# Patient Record
Sex: Female | Born: 1990
Health system: Southern US, Community
[De-identification: ages and names within clinical notes are randomized; demographics above are authoritative.]

## PROBLEM LIST (undated history)

## (undated) DIAGNOSIS — F32A Depression, unspecified: Secondary | ICD-10-CM

## (undated) DIAGNOSIS — R519 Headache, unspecified: Secondary | ICD-10-CM

## (undated) DIAGNOSIS — F419 Anxiety disorder, unspecified: Secondary | ICD-10-CM

## (undated) DIAGNOSIS — F329 Major depressive disorder, single episode, unspecified: Secondary | ICD-10-CM

## (undated) DIAGNOSIS — R51 Headache: Secondary | ICD-10-CM

## (undated) DIAGNOSIS — Z87440 Personal history of urinary (tract) infections: Secondary | ICD-10-CM

## (undated) HISTORY — DX: Headache: R51

## (undated) HISTORY — DX: Personal history of urinary (tract) infections: Z87.440

## (undated) HISTORY — DX: Major depressive disorder, single episode, unspecified: F32.9

## (undated) HISTORY — DX: Headache, unspecified: R51.9

## (undated) HISTORY — DX: Anxiety disorder, unspecified: F41.9

## (undated) HISTORY — DX: Depression, unspecified: F32.A

---

## 2014-05-20 LAB — HM PAP SMEAR: HM Pap smear: NEGATIVE

## 2015-04-04 LAB — BASIC METABOLIC PANEL
BUN: 11 mg/dL (ref 4–21)
CREATININE: 0.7 mg/dL (ref 0.5–1.1)
Glucose: 94 mg/dL
Potassium: 4.5 mmol/L (ref 3.4–5.3)
Sodium: 140 mmol/L (ref 137–147)

## 2015-04-04 LAB — HEPATIC FUNCTION PANEL
ALK PHOS: 72 U/L (ref 25–125)
ALT: 9 U/L (ref 7–35)
AST: 16 U/L (ref 13–35)

## 2015-04-04 LAB — CBC AND DIFFERENTIAL
HEMATOCRIT: 40 % (ref 36–46)
Hemoglobin: 13.1 g/dL (ref 12.0–16.0)
PLATELETS: 251 10*3/uL (ref 150–399)
WBC: 4.2 10*3/mL

## 2015-04-04 LAB — LIPID PANEL
CHOLESTEROL: 179 mg/dL (ref 0–200)
HDL: 56 mg/dL (ref 35–70)
LDL CALC: 99 mg/dL
Triglycerides: 119 mg/dL (ref 40–160)

## 2015-04-04 LAB — TSH: TSH: 1.43 u[IU]/mL (ref 0.41–5.90)

## 2016-08-03 ENCOUNTER — Ambulatory Visit (INDEPENDENT_AMBULATORY_CARE_PROVIDER_SITE_OTHER): Payer: No Typology Code available for payment source | Admitting: Family Medicine

## 2016-08-03 ENCOUNTER — Encounter: Payer: Self-pay | Admitting: Family Medicine

## 2016-08-03 DIAGNOSIS — F3341 Major depressive disorder, recurrent, in partial remission: Secondary | ICD-10-CM

## 2016-08-03 DIAGNOSIS — G8929 Other chronic pain: Secondary | ICD-10-CM | POA: Diagnosis not present

## 2016-08-03 DIAGNOSIS — F324 Major depressive disorder, single episode, in partial remission: Secondary | ICD-10-CM | POA: Insufficient documentation

## 2016-08-03 DIAGNOSIS — G44229 Chronic tension-type headache, not intractable: Secondary | ICD-10-CM | POA: Insufficient documentation

## 2016-08-03 DIAGNOSIS — M549 Dorsalgia, unspecified: Secondary | ICD-10-CM

## 2016-08-03 MED ORDER — NAPROXEN 500 MG PO TABS: 500.0000 mg | ORAL_TABLET | Freq: Two times a day (BID) | ORAL | 1 refills | Status: DC

## 2016-08-03 MED ORDER — BACLOFEN 10 MG PO TABS: 10.0000 mg | ORAL_TABLET | Freq: Every evening | ORAL | 1 refills | Status: DC | PRN

## 2016-08-03 NOTE — Progress Notes (Signed)
Pre visit review using our clinic review tool, if applicable. No additional management support is needed unless otherwise documented below in the visit note. 

## 2016-08-03 NOTE — Patient Instructions (Signed)
A few things to remember from today's visit:   Chronic upper back pain - Plan: Ambulatory referral to Physical Therapy, baclofen (LIORESAL) 10 MG tablet, naproxen (NAPROSYN) 500 MG tablet  Chronic tension-type headache, not intractable - Plan: baclofen (LIORESAL) 10 MG tablet, naproxen (NAPROSYN) 500 MG tablet  Let me know in 3-4 weeks if you feel like you want to try Amitriptyline.    Please be sure medication list is accurate. If a new problem present, please set up appointment sooner than planned today.

## 2016-08-03 NOTE — Progress Notes (Signed)
HPI:   [name] is a 26 y.o. female, who is here today to establish care with me.  Former PCP: [facility]. Last preventive routine visit: 1-2 years ago.   Chronic medical problems: Depression and headaches.   Concerns today: Headache and back pain.   Reporting Hx of tension like headaches, which were diagnosed a few years ago while she was in [employer]. Symptoms improved for a few months and re-occurred.  + Upper back pain (achy), not radiated to UE's, exacerbated by movement. Alleviated by local heat and position changes.  Headache: dull/achy pain and sometimes sharp, "very painful" on occipital, parietal, and frontal area.Sometiems retro ocular L>R , no associated photophobia, and sometimes with mild nausea with no vomiting.   It seems to be exacerbated by stress. Alleviating factors in the past: Naproxen and PT/chiropractor treatment. After PT she did not have headache for about a year. Mobic did not help.  She is having daily symptoms, for the past 2 weeks she has woken up with headache. She has not tried OTC medications.  Hx of depression, she is on Sertraline, which was started by college health care provider. She denies current symptoms or  suicidal thoughts. For the past 2 weeks she has been waking up in the middle of the night, able to go back to sleep.    Review of Systems  Constitutional: Positive for fatigue. Negative for activity change, appetite change, fever and unexpected weight change.  HENT: Negative for congestion, mouth sores, sinus pain, sore throat and trouble swallowing.   Eyes: Negative for photophobia, redness and visual disturbance.  Respiratory: Negative for cough, shortness of breath and wheezing.   Cardiovascular: Negative for leg swelling.  Gastrointestinal: Positive for nausea. Negative for abdominal pain and vomiting.       No changes in bowel habits.  Endocrine: Negative for cold intolerance and heat  intolerance.  Genitourinary: Negative for decreased urine volume and hematuria.  Musculoskeletal: Positive for back pain. Negative for arthralgias, gait problem, joint swelling and neck stiffness.  Skin: Negative for rash.  Neurological: Positive for headaches. Negative for syncope, weakness and numbness.  Hematological: Negative for adenopathy. Does not bruise/bleed easily.  Psychiatric/Behavioral: Positive for sleep disturbance. Negative for confusion and suicidal ideas. The patient is not nervous/anxious.       No current outpatient prescriptions on file prior to visit.   No current facility-administered medications on file prior to visit.      Past Medical History:  Diagnosis Date  . Anxiety   . Depression   . Frequent headaches   . History of frequent urinary tract infections    No Known Allergies  Family History  Problem Relation Age of Onset  . Heart disease Maternal Grandfather     Social History   Social History  . Marital status: Single    Spouse name: N/A  . Number of children: N/A  . Years of education: N/A   Social History Main Topics  . Smoking status: Never Smoker  . Smokeless tobacco: Never Used  . Alcohol use No  . Drug use: No  . Sexual activity: Not Asked   Other Topics Concern  . None   Social History Narrative  . None    Vitals:   08/03/16 1356  BP: 104/70  Pulse: 78  Resp: 12   Body mass index is 23.42 kg/m.   Physical Exam  Nursing note and vitals reviewed. Constitutional: She is oriented to person, place,  and time. She appears well-developed and well-nourished. She does not appear ill. No distress.  HENT:  Head: Atraumatic.  Nose: Right sinus exhibits no maxillary sinus tenderness and no frontal sinus tenderness. Left sinus exhibits no maxillary sinus tenderness and no frontal sinus tenderness.  Mouth/Throat: Oropharynx is clear and moist and mucous membranes are normal.  Eyes: Conjunctivae and EOM are normal. Pupils are  equal, round, and reactive to light.  Cardiovascular: Normal rate and regular rhythm.   No murmur heard. Respiratory: Effort normal and breath sounds normal. No respiratory distress.  GI: Soft. She exhibits no mass. There is no hepatomegaly. There is no tenderness.  Musculoskeletal: She exhibits no edema.  No significant deformity appreciated. + Tenderness upon palpation of paraspinal muscles cervical and right thoracic.Traopezium. Mild limitation right rotation of cervical spine.  No local edema or erythema appreciated, no suspicious lesions.  Lymphadenopathy:    She has no cervical adenopathy.  Neurological: She is alert and oriented to person, place, and time. She has normal strength. No cranial nerve deficit. Coordination and gait normal.  Skin: Skin is warm. No rash noted. No erythema.  Psychiatric: She has a normal mood and affect.  Well groomed, good eye contact.      ASSESSMENT AND PLAN:   Caitlyn Munoz was seen today for establish care.  Diagnoses and all orders for this visit:  Chronic upper back pain  PT has helped before, will arrange evaluation. Naproxen bid as needed and baclofen. Some side effects of medications discussed. F/U in 3 months.  -     Ambulatory referral to Physical Therapy -     baclofen (LIORESAL) 10 MG tablet; Take 1 tablet (10 mg total) by mouth at bedtime as needed for muscle spasms. -     naproxen (NAPROSYN) 500 MG tablet; Take 1 tablet (500 mg total) by mouth 2 (two) times daily with a meal. As needed for headache  Chronic tension-type headache, not intractable  We discussed treatment options: TCA and/or Baclofen. She is going to try Baclofen first. She was instructed to let me know in 3-4 weeks about benefit and/or tolerance. Will consider adding Amitriptyline. Instructed about warning signs., F/U in 3 months, before if needed.  -     baclofen (LIORESAL) 10 MG tablet; Take 1 tablet (10 mg total) by mouth at bedtime as needed for muscle  spasms. -     naproxen (NAPROSYN) 500 MG tablet; Take 1 tablet (500 mg total) by mouth 2 (two) times daily with a meal. As needed for headache  Recurrent major depressive disorder, in partial remission (HCC)  Stable overall. No changes for now. F/U in 3 months.       Betty G. [place], MD  [facility]. [place] office.

## 2016-08-04 ENCOUNTER — Encounter: Payer: Self-pay | Admitting: Family Medicine

## 2016-08-18 ENCOUNTER — Ambulatory Visit (INDEPENDENT_AMBULATORY_CARE_PROVIDER_SITE_OTHER): Payer: No Typology Code available for payment source | Admitting: Family Medicine

## 2016-08-18 ENCOUNTER — Encounter: Payer: Self-pay | Admitting: Family Medicine

## 2016-08-18 VITALS — BP 120/78 | HR 93 | Temp 98.4°F | Resp 12 | Ht 68.0 in | Wt 155.0 lb

## 2016-08-18 DIAGNOSIS — J069 Acute upper respiratory infection, unspecified: Secondary | ICD-10-CM

## 2016-08-18 DIAGNOSIS — R0789 Other chest pain: Secondary | ICD-10-CM

## 2016-08-18 MED ORDER — FLUTICASONE PROPIONATE 50 MCG/ACT NA SUSP: 1.0000 | Freq: Two times a day (BID) | NASAL | 0 refills | Status: DC

## 2016-08-18 MED ORDER — BENZONATATE 100 MG PO CAPS: 200.0000 mg | ORAL_CAPSULE | Freq: Two times a day (BID) | ORAL | 0 refills | Status: AC | PRN

## 2016-08-18 NOTE — Progress Notes (Signed)
HPI:  ACUTE VISIT  Chief Complaint  Patient presents with  . chest congestion    [name] is a 26 y.o.female here today with her roommate complaining of 3 days of respiratory symptoms.  Mostly non productive cough, occasional greenish sputum. + Nasal congestion, rhinorrhea, sore throat, and post nasal drainage. Night sweats, not sure about fever. + Chills and body aches. She has upper bilateral chest pain, exacerbated by cough and deep breathing. She has some SOB, at rest, denies wheezing.  No Hx of recent travel. Sick contact: Co-workers No known insect bite.  Hx of allergies: No  OTC medications for this problem: Tylenol 2 days ago and Sudafed.  Symptoms otherwise improving (sore throat).   Review of Systems  Constitutional: Positive for appetite change, chills and fatigue.  HENT: Positive for congestion, postnasal drip, sore throat and voice change. Negative for ear pain, mouth sores, nosebleeds, sinus pain, sneezing and trouble swallowing.   Eyes: Negative for discharge and redness.  Respiratory: Positive for cough. Negative for wheezing.   Cardiovascular: Negative for palpitations and leg swelling.  Gastrointestinal: Negative for abdominal pain, diarrhea, nausea and vomiting.  Musculoskeletal: Positive for myalgias. Negative for joint swelling and neck pain.  Skin: Negative for rash.  Neurological: Positive for headaches (frontal pressure). Negative for syncope and weakness.  Hematological: Negative for adenopathy. Does not bruise/bleed easily.  Psychiatric/Behavioral: Negative for confusion.      Current Outpatient Prescriptions on File Prior to Visit  Medication Sig Dispense Refill  . baclofen (LIORESAL) 10 MG tablet Take 1 tablet (10 mg total) by mouth at bedtime as needed for muscle spasms. 30 each 1  . naproxen (NAPROSYN) 500 MG tablet Take 1 tablet (500 mg total) by mouth 2 (two) times daily with a meal. As needed for headache  30 tablet 1  . sertraline (ZOLOFT) 50 MG tablet Take 50 mg by mouth daily.  0   No current facility-administered medications on file prior to visit.      Past Medical History:  Diagnosis Date  . Anxiety   . Depression   . Frequent headaches   . History of frequent urinary tract infections    No Known Allergies  Social History   Social History  . Marital status: Single    Spouse name: N/A  . Number of children: N/A  . Years of education: N/A   Social History Main Topics  . Smoking status: Never Smoker  . Smokeless tobacco: Never Used  . Alcohol use No  . Drug use: No  . Sexual activity: Not Asked   Other Topics Concern  . None   Social History Narrative  . None    Vitals:   08/18/16 0932  BP: 120/78  Pulse: 93  Resp: 12  Temp: 98.4 F (36.9 C)  O2 sat at RA 99%. Body mass index is 23.57 kg/m.    Physical Exam  Nursing note and vitals reviewed. Constitutional: She is oriented to person, place, and time. She appears well-developed and well-nourished. She does not appear ill. No distress.  HENT:  Head: Atraumatic.  Right Ear: Tympanic membrane, external ear and ear canal normal.  Left Ear: Tympanic membrane, external ear and ear canal normal.  Nose: Rhinorrhea present. Right sinus exhibits no maxillary sinus tenderness and no frontal sinus tenderness. Left sinus exhibits no maxillary sinus tenderness and no frontal sinus tenderness.  Mouth/Throat: Uvula is midline and mucous membranes are normal. Posterior oropharyngeal erythema (Mild) present.  Nasal voice.  Hypertrophic turbinates. Post nasal drainage.  Eyes: Conjunctivae and EOM are normal.  Neck: No muscular tenderness present.  Cardiovascular: Normal rate and regular rhythm.   No murmur heard. Respiratory: Effort normal and breath sounds normal. No stridor. No respiratory distress. She exhibits tenderness.  Lymphadenopathy:       Head (right side): No submandibular adenopathy present.       Head  (left side): No submandibular adenopathy present.    She has cervical adenopathy.       Right cervical: Posterior cervical adenopathy present.       Left cervical: Posterior cervical adenopathy present.  Neurological: She is alert and oriented to person, place, and time. She has normal strength.  Skin: Skin is warm. No rash noted. No erythema.  Psychiatric: She has a normal mood and affect. Her speech is normal.  Well groomed, good eye contact.      ASSESSMENT AND PLAN:   [name] was seen today for chest congestion.  Diagnoses and all orders for this visit:  URI, acute  Symptoms suggests a viral etiology, I explained patient that symptomatic treatment is usually recommended in this case. Instructed to monitor for signs of complications,instructed about warning signs. Lung auscultation was normal, we discussed the possibility of having CXR but we agree on holding on imaging for now. I also explained that cough and nasal congestion can last a few days and sometimes weeks. Note for work given. F/U as needed.   -     benzonatate (TESSALON) 100 MG capsule; Take 2 capsules (200 mg total) by mouth 2 (two) times daily as needed for cough. -     fluticasone (FLONASE) 50 MCG/ACT nasal spray; Place 1 spray into both nostrils 2 (two) times daily.  Chest wall pain  Musculoskeletal chest pain, ? Costochondritis. OTC Tylenol and/or NSAID's may help. Instructed about warning signs.      -Caitlyn Munoz was advised to return or notify a doctor immediately if symptoms worsen or persist or new concerns arise.       Yolando Gillum G. [place], MD  [facility]. [place] office.

## 2016-08-18 NOTE — Progress Notes (Signed)
Pre visit review using our clinic review tool, if applicable. No additional management support is needed unless otherwise documented below in the visit note. 

## 2016-08-18 NOTE — Patient Instructions (Signed)
A few things to remember from today's visit:   URI, acute - Plan: benzonatate (TESSALON) 100 MG capsule, fluticasone (FLONASE) 50 MCG/ACT nasal spray  viral infections are self-limited and we treat each symptom depending of severity.   Over the counter medications as decongestants and cold medications usually help, they need to be taken with caution if there is a history of high blood pressure or palpitations.  Tylenol and/or Ibuprofen also helps with most symptoms (headache, muscle aching, fever,etc) Plenty of fluids. Honey helps with cough. Steam inhalations helps with runny nose, nasal congestion, and may prevent sinus infections. Cough and nasal congestion could last a few days and sometimes weeks. Please follow in not any better in 1-2 weeks or if symptoms get worse.   Please be sure medication list is accurate.

## 2016-08-19 ENCOUNTER — Encounter (HOSPITAL_COMMUNITY): Payer: Self-pay | Admitting: Emergency Medicine

## 2016-08-19 ENCOUNTER — Emergency Department (HOSPITAL_COMMUNITY)
Admission: EM | Admit: 2016-08-19 | Discharge: 2016-08-19 | Disposition: A | Discharge status: Home / Self Care | Payer: No Typology Code available for payment source | Attending: Emergency Medicine | Admitting: Emergency Medicine

## 2016-08-19 ENCOUNTER — Emergency Department (HOSPITAL_COMMUNITY): Payer: No Typology Code available for payment source

## 2016-08-19 DIAGNOSIS — J111 Influenza due to unidentified influenza virus with other respiratory manifestations: Secondary | ICD-10-CM | POA: Diagnosis not present

## 2016-08-19 DIAGNOSIS — Z79899 Other long term (current) drug therapy: Secondary | ICD-10-CM | POA: Insufficient documentation

## 2016-08-19 DIAGNOSIS — R69 Illness, unspecified: Secondary | ICD-10-CM

## 2016-08-19 DIAGNOSIS — R0602 Shortness of breath: Secondary | ICD-10-CM | POA: Diagnosis present

## 2016-08-19 MED ORDER — HYDROCODONE-ACETAMINOPHEN 5-325 MG PO TABS
1.0000 | ORAL_TABLET | Freq: Once | ORAL | Status: AC
  Administered 2016-08-19: 1 via ORAL
  Filled 2016-08-19: qty 1

## 2016-08-19 MED ORDER — ALBUTEROL SULFATE HFA 108 (90 BASE) MCG/ACT IN AERS
2.0000 | INHALATION_SPRAY | Freq: Once | RESPIRATORY_TRACT | Status: AC
  Administered 2016-08-19: 2 via RESPIRATORY_TRACT
  Filled 2016-08-19: qty 6.7

## 2016-08-19 MED ORDER — HYDROCODONE-ACETAMINOPHEN 5-325 MG PO TABS: ORAL_TABLET | ORAL | 0 refills | Status: DC

## 2016-08-19 MED ORDER — OSELTAMIVIR PHOSPHATE 75 MG PO CAPS: 75.0000 mg | ORAL_CAPSULE | Freq: Two times a day (BID) | ORAL | 0 refills | Status: DC

## 2016-08-19 MED ORDER — ALBUTEROL SULFATE (2.5 MG/3ML) 0.083% IN NEBU
5.0000 mg | INHALATION_SOLUTION | Freq: Once | RESPIRATORY_TRACT | Status: AC
  Administered 2016-08-19: 5 mg via RESPIRATORY_TRACT
  Filled 2016-08-19: qty 6

## 2016-08-19 NOTE — ED Triage Notes (Signed)
Patient states that she having congestion and sore throat since Sunday. Saw PCP yesterday and told was viral.  Patient c/o of SOB.

## 2016-08-19 NOTE — ED Provider Notes (Signed)
[facility]-EMERGENCY DEPT Provider Note   CSN: [id number] Arrival date & time: 08/19/16  1851 By signing my name below, I, Levon Hedger, attest that this documentation has been prepared under the direction and in the presence of non-physician practitioner, Wynetta Emery, PA-C. Electronically Signed: Levon Hedger, Scribe. 08/19/2016. 7:34 PM.   History   Chief Complaint Chief Complaint  Patient presents with  . Nasal Congestion  . Shortness of Breath  . Sore Throat   HPI Caitlyn Munoz is a 26 y.o. female who presents to the Emergency Department complaining of gradually worsening, constant congestion onset four days ago.  No alleviating or modifying factors noted. She notes associated cough, shortness of breath, pleuritic chest pain, night sweats, subjective fever, headache,  Generalized body aches, decreased appetite and fatigue. Per pt, she was seen at her PCP yesterday and was told this was viral. Pt takes Zoloft daily and baclofen as needed, but denies any other daily medications. She is a nonsmoker with no history of asthma. She denies any neck pain, neck stiffness, nausea, vomiting, or any other associated symptoms.  The history is provided by the patient. No language interpreter was used.   Past Medical History:  Diagnosis Date  . Anxiety   . Depression   . Frequent headaches   . History of frequent urinary tract infections     Patient Active Problem List   Diagnosis Date Noted  . Chronic upper back pain 08/03/2016  . Headache, tension type, chronic 08/03/2016  . Depression, major, in partial remission (HCC) 08/03/2016   History reviewed. No pertinent surgical history.  OB History    No data available     Home Medications    Prior to Admission medications   Medication Sig Start Date End Date Taking? Authorizing Provider  baclofen (LIORESAL) 10 MG tablet Take 1 tablet (10 mg total) by mouth at bedtime as needed for muscle spasms. 08/03/16   Betty G [place], MD   benzonatate (TESSALON) 100 MG capsule Take 2 capsules (200 mg total) by mouth 2 (two) times daily as needed for cough. 08/18/16 08/28/16  Betty G [place], MD  fluticasone (FLONASE) 50 MCG/ACT nasal spray Place 1 spray into both nostrils 2 (two) times daily. 08/18/16 09/17/16  Betty G [place], MD  naproxen (NAPROSYN) 500 MG tablet Take 1 tablet (500 mg total) by mouth 2 (two) times daily with a meal. As needed for headache 08/03/16   Betty G [place], MD  sertraline (ZOLOFT) 50 MG tablet Take 50 mg by mouth daily. 07/01/16   Historical Provider, MD    Family History Family History  Problem Relation Age of Onset  . Heart disease Maternal Grandfather     Social History Social History  Substance Use Topics  . Smoking status: Never Smoker  . Smokeless tobacco: Never Used  . Alcohol use No    Allergies   Patient has no known allergies.  Review of Systems Review of Systems 10 systems reviewed and all are negative for acute change except as noted in the HPI.   Physical Exam Updated Vital Signs BP 133/99 (BP Location: Right Arm)   Pulse 91   Temp 97.7 F (36.5 C) (Oral)   Resp 18   LMP 08/11/2016   SpO2 100%   Physical Exam  Constitutional: She is oriented to person, place, and time. She appears well-developed and well-nourished. No distress.  HENT:  Head: Normocephalic and atraumatic.  Right Ear: External ear normal.  Left Ear: External ear normal.  Mouth/Throat: Oropharynx is  clear and moist. No oropharyngeal exudate.  No drooling or stridor. Posterior pharynx mildly erythematous no significant tonsillar hypertrophy. No exudate. Soft palate rises symmetrically. No TTP or induration under tongue.   No tenderness to palpation of frontal or bilateral maxillary sinuses.  Mild mucosal edema in the nares with scant rhinorrhea.  Bilateral tympanic membranes with normal architecture and good light reflex.    Eyes: Conjunctivae and EOM are normal. Pupils are equal, round, and reactive to  light.  Neck: Normal range of motion. Neck supple.  Cardiovascular: Normal rate and regular rhythm.   Pulmonary/Chest: Effort normal and breath sounds normal. No stridor. No respiratory distress. She has no wheezes. She has no rales. She exhibits no tenderness.  Abdominal: Soft. She exhibits no distension. There is no tenderness. There is no rebound and no guarding.  Neurological: She is alert and oriented to person, place, and time.  Skin: Skin is warm and dry.  Psychiatric: She has a normal mood and affect.  Nursing note and vitals reviewed.  ED Treatments / Results  DIAGNOSTIC STUDIES:  Oxygen Saturation is 100% on RA, normal by my interpretation.    COORDINATION OF CARE:  7:23 PM Will order CXR. Discussed treatment plan with pt at bedside and pt agreed to plan.   Labs (all labs ordered are listed, but only abnormal results are displayed) Labs Reviewed - No data to display  EKG  EKG Interpretation None       Radiology No results found.  Procedures Procedures (including critical care time)  Medications Ordered in ED Medications - No data to display   Initial Impression / Assessment and Plan / ED Course  I have reviewed the triage vital signs and the nursing notes.  Pertinent labs & imaging results that were available during my care of the patient were reviewed by me and considered in my medical decision making (see chart for details).     Vitals:   08/19/16 1857 08/19/16 1959  BP: 133/99   Pulse: 91   Resp: 18   Temp: 97.7 F (36.5 C)   TempSrc: Oral   SpO2: 100% 100%    Medications  albuterol (PROVENTIL HFA;VENTOLIN HFA) 108 (90 Base) MCG/ACT inhaler 2 puff (2 puffs Inhalation Given 08/19/16 2016)  albuterol (PROVENTIL) (2.5 MG/3ML) 0.083% nebulizer solution 5 mg (5 mg Nebulization Given 08/19/16 1958)  HYDROcodone-acetaminophen (NORCO/VICODIN) 5-325 MG per tablet 1 tablet (1 tablet Oral Given 08/19/16 1950)    Caitlyn Munoz is 26 y.o.  female presenting with NAD, Non-toxic appearing, AFVSS, LSCTA. Likely viral illness, advised patient to push fluids, Patient given Tamiflu, albuterol comfort. Chest x-rays without infiltrate, bilateral hearing.  Evaluation does not show pathology that would require ongoing emergent intervention or inpatient treatment. Pt is hemodynamically stable and mentating appropriately. Discussed findings and plan with patient/guardian, who agrees with care plan. All questions answered. Return precautions discussed and outpatient follow up given.    Final Clinical Impressions(s) / ED Diagnoses   Final diagnoses:  Influenza-like illness   New Prescriptions Discharge Medication List as of 08/19/2016  8:09 PM    START taking these medications   Details  HYDROcodone-acetaminophen (NORCO/VICODIN) 5-325 MG tablet Take 1-2 tablets by mouth every 6 hours as needed for pain and/or cough., Print    oseltamivir (TAMIFLU) 75 MG capsule Take 1 capsule (75 mg total) by mouth every 12 (twelve) hours., Starting Thu 08/19/2016, Print        I personally performed the services described in this documentation, which was  scribed in my presence. The recorded information has been reviewed and is accurate.    Wynetta Emeryicole Markcus Lazenby, PA-C 08/19/16 2110    Pricilla LovelessScott Goldston, MD 08/20/16 [phone]

## 2016-08-19 NOTE — ED Notes (Signed)
Respiratory called for breathing treatment.

## 2016-08-19 NOTE — Discharge Instructions (Signed)

## 2016-10-06 ENCOUNTER — Other Ambulatory Visit: Payer: Self-pay

## 2016-10-06 DIAGNOSIS — M549 Dorsalgia, unspecified: Principal | ICD-10-CM

## 2016-10-06 DIAGNOSIS — G44229 Chronic tension-type headache, not intractable: Secondary | ICD-10-CM

## 2016-10-06 DIAGNOSIS — G8929 Other chronic pain: Secondary | ICD-10-CM

## 2016-10-06 MED ORDER — BACLOFEN 10 MG PO TABS: 10.0000 mg | ORAL_TABLET | Freq: Every evening | ORAL | 0 refills | Status: DC | PRN

## 2016-10-26 ENCOUNTER — Encounter: Payer: Self-pay | Admitting: Family Medicine

## 2016-10-26 ENCOUNTER — Ambulatory Visit (INDEPENDENT_AMBULATORY_CARE_PROVIDER_SITE_OTHER): Payer: No Typology Code available for payment source | Admitting: Family Medicine

## 2016-10-26 ENCOUNTER — Ambulatory Visit (INDEPENDENT_AMBULATORY_CARE_PROVIDER_SITE_OTHER)
Admission: RE | Admit: 2016-10-26 | Discharge: 2016-10-26 | Disposition: A | Discharge status: Home / Self Care | Payer: No Typology Code available for payment source | Source: Ambulatory Visit | Attending: Family Medicine | Admitting: Family Medicine

## 2016-10-26 VITALS — BP 124/70 | HR 89 | Resp 12 | Ht 68.0 in | Wt 156.4 lb

## 2016-10-26 DIAGNOSIS — M25512 Pain in left shoulder: Secondary | ICD-10-CM | POA: Diagnosis not present

## 2016-10-26 DIAGNOSIS — S46012A Strain of muscle(s) and tendon(s) of the rotator cuff of left shoulder, initial encounter: Secondary | ICD-10-CM

## 2016-10-26 DIAGNOSIS — F3341 Major depressive disorder, recurrent, in partial remission: Secondary | ICD-10-CM

## 2016-10-26 DIAGNOSIS — G44229 Chronic tension-type headache, not intractable: Secondary | ICD-10-CM

## 2016-10-26 MED ORDER — CELECOXIB 200 MG PO CAPS: 200.0000 mg | ORAL_CAPSULE | Freq: Two times a day (BID) | ORAL | 0 refills | Status: AC

## 2016-10-26 MED ORDER — SERTRALINE HCL 50 MG PO TABS: 50.0000 mg | ORAL_TABLET | Freq: Every day | ORAL | 2 refills | Status: DC

## 2016-10-26 MED ORDER — KETOROLAC TROMETHAMINE 60 MG/2ML IM SOLN
60.0000 mg | Freq: Once | INTRAMUSCULAR | Status: AC
  Administered 2016-10-26: 60 mg via INTRAMUSCULAR

## 2016-10-26 NOTE — Progress Notes (Signed)
HPI:   CaitlynCaitlyn Munoz is a 26 y.o. female, who is here today with her friend for 3 months follow up.   Last OV 08/03/16, when she was c/o headache and depression.  Tension headache: Headache and upper back pain have improved greatly. She has occasional episodes, she takes Naproxen and Baclofen as needed. Episodes from 0-1 time per week.  Depression:  She is currently on Sertraline 50 mg daily. She denies depressed mood or suicidal thoughts. No changes in sleep. In general she feels like medications has helped and continue doing so.  Concerns today: Left shoulder pain.  Today she is c/o 5-6 months of intermittent sharp left shoulder pain. It has been mild and resolves in a couple days but this time it is severe and persistent. Pain started suddenly yesterday around 11-11:30 Am when she was in bed and reaching down the floor to pet her dog.Stabbing severe pain,constant, no radiated, no numbness or tingling. Limitation of ROM due to pain., She has no noted deformity, local ecchymosis,or erythema.  She took Naproxen 500 mg about 5 hours ago,helped little.   Review of Systems  Constitutional: Negative for appetite change, fatigue and fever.  Respiratory: Negative for cough, shortness of breath and wheezing.   Cardiovascular: Negative for chest pain.  Gastrointestinal: Negative for abdominal pain, nausea and vomiting.       No changes in bowel habits.  Musculoskeletal: Positive for arthralgias. Negative for joint swelling and neck pain.  Skin: Negative for color change and rash.  Neurological: Negative for syncope, weakness, numbness and headaches.  Hematological: Negative for adenopathy. Does not bruise/bleed easily.  Psychiatric/Behavioral: Negative for confusion, hallucinations, sleep disturbance and suicidal ideas. The patient is not nervous/anxious.       Current Outpatient Prescriptions on File Prior to Visit  Medication Sig Dispense Refill  .  baclofen (LIORESAL) 10 MG tablet Take 1 tablet (10 mg total) by mouth at bedtime as needed for muscle spasms. 90 each 0  . naproxen (NAPROSYN) 500 MG tablet Take 1 tablet (500 mg total) by mouth 2 (two) times daily with a meal. As needed for headache 30 tablet 1   No current facility-administered medications on file prior to visit.      Past Medical History:  Diagnosis Date  . Anxiety   . Depression   . Frequent headaches   . History of frequent urinary tract infections    No Known Allergies  Social History   Social History  . Marital status: Single    Spouse name: N/A  . Number of children: N/A  . Years of education: N/A   Social History Main Topics  . Smoking status: Never Smoker  . Smokeless tobacco: Never Used  . Alcohol use No  . Drug use: No  . Sexual activity: Not Asked   Other Topics Concern  . None   Social History Narrative  . None    Vitals:   10/26/16 1458  BP: 124/70  Pulse: 89  Resp: 12  O2 sat at RA 98% Body mass index is 23.78 kg/m.   Physical Exam  Nursing note and vitals reviewed. Constitutional: She is oriented to person, place, and time. She appears well-developed and well-nourished. No distress.  HENT:  Mouth/Throat: Oropharynx is clear and moist and mucous membranes are normal.  Eyes: Conjunctivae and EOM are normal.  Cardiovascular: Normal rate and regular rhythm.   No murmur heard. Pulses:      Radial pulses are 2+ on the  right side, and 2+ on the left side.  Respiratory: Effort normal and breath sounds normal. No respiratory distress.  Musculoskeletal: She exhibits no edema.       Left shoulder: She exhibits decreased range of motion and tenderness. She exhibits no swelling, no crepitus and no deformity.  Left shoulder: No deformity, edema, or erythema appreciated.No muscle atrophy. Caitlyn Munoz' test pos, drop arm rotator cuff test positive, empty can supraspinatus test pos, cross body adduction test pos, lift-Off Subscapularis test  pos. ZOX:WRUEAV limitation of active ROM, moderate limitation with passive movement.  Lymphadenopathy:    She has no cervical adenopathy.  Neurological: She is alert and oriented to person, place, and time. She has normal strength. Gait normal.  Skin: Skin is warm. No rash noted. No erythema.  Psychiatric: She has a normal mood and affect. Cognition and memory are normal. She expresses no suicidal ideation.  Well groomed, good eye contact.    ASSESSMENT AND PLAN:   Caitlyn Munoz was seen today for follow-up.  Diagnoses and all orders for this visit:  Acute pain of left shoulder  We discussed possible etiology: Rotator cuff sprain,partial tear, and labrum injury among some. She is concerned about shoulder dislocation and wonders if she needs a swing. Examnation today is not suggestive of rupture or dislocation. I do not think immobilization is needed at this time. Shoulder X ray ordered. Here today after verbal consent Toradol 60 mg IM given.  -     DG Shoulder Left; Future -     Cancel: Ambulatory referral to Physical Therapy -     celecoxib (CELEBREX) 200 MG capsule; Take 1 capsule (200 mg total) by mouth 2 (two) times daily. -     ketorolac (TORADOL) injection 60 mg; Inject 2 mLs (60 mg total) into the muscle once.  Rotator cuff strain, left, initial encounter  She agrees with PT. Hold on Naproxen for a few days and instead try Celebrex 200 mg bid for 7-10 days. Local ice. ROM exercises while waiting for PT. She will let me know if pain is not any better in 7-10 days, in which case shoulder MRI and/or ortho may be necessary.  -     Ambulatory referral to Physical Therapy  Recurrent major depressive disorder, in partial remission (HCC)  Stable otherwise. No changes in current management. F/U in 6 months.  -     sertraline (ZOLOFT) 50 MG tablet; Take 1 tablet (50 mg total) by mouth daily.  Chronic tension-type headache, not intractable  Improved. She can continue Baclofen  10 mg tid as needed. Reviewed some side effects.     -Caitlyn Munoz was advised to return sooner than planned today if new concerns arise.       Eldrige Pitkin G. [place], MD  [facility]. [place] office.

## 2016-10-26 NOTE — Progress Notes (Signed)
Pre visit review using our clinic review tool, if applicable. No additional management support is needed unless otherwise documented below in the visit note. 

## 2016-10-26 NOTE — Patient Instructions (Addendum)
A few things to remember from today's visit:   Acute pain of left shoulder - Plan: DG Shoulder Left, Ambulatory referral to Physical Therapy, celecoxib (CELEBREX) 200 MG capsule  Rotator cuff strain, left, initial encounter - Plan: Ambulatory referral to Physical Therapy  Recurrent major depressive disorder, in partial remission (HCC) - Plan: sertraline (ZOLOFT) 50 MG tablet  Range of motion exercises. Celebrex for 7-10 days, do not take Naproxen.   Please be sure medication list is accurate. If a new problem present, please set up appointment sooner than planned today.

## 2016-10-27 ENCOUNTER — Telehealth: Payer: Self-pay | Admitting: Family Medicine

## 2016-10-27 ENCOUNTER — Telehealth: Payer: Self-pay | Admitting: *Deleted

## 2016-10-27 DIAGNOSIS — M25512 Pain in left shoulder: Secondary | ICD-10-CM

## 2016-10-27 NOTE — Telephone Encounter (Signed)
Pt calling stating that [facility] is not in her network and would like to see if you could send a referral to [facility] [address] [place]. [place], [place]  [address]  [phone]

## 2016-10-27 NOTE — Telephone Encounter (Signed)
New referral placed.

## 2016-10-27 NOTE — Telephone Encounter (Signed)
Patient reports continued left shoulder pain; patient states celebrex is not helping, patient to start physical therapy on Monday, but wants further advise on pain management until then. Advised patient to continue with celebrex, utilize hot/cold therapy, if pain is severe can go to ER otherwise, MD will review note and nursing will call her back with any new orders/recommendations.

## 2016-10-28 ENCOUNTER — Ambulatory Visit (INDEPENDENT_AMBULATORY_CARE_PROVIDER_SITE_OTHER): Payer: No Typology Code available for payment source | Admitting: Sports Medicine

## 2016-10-28 ENCOUNTER — Ambulatory Visit: Payer: Self-pay

## 2016-10-28 VITALS — BP 100/70 | HR 91 | Ht 68.0 in | Wt 154.4 lb

## 2016-10-28 DIAGNOSIS — M25512 Pain in left shoulder: Secondary | ICD-10-CM | POA: Diagnosis not present

## 2016-10-28 DIAGNOSIS — M7502 Adhesive capsulitis of left shoulder: Secondary | ICD-10-CM | POA: Diagnosis not present

## 2016-10-28 NOTE — Telephone Encounter (Signed)
She can be referred to Sport Medicine. I wonder if they have an opening today or tomorrow. Thanks, BJ

## 2016-10-28 NOTE — Progress Notes (Signed)
OFFICE VISIT NOTE Veverly FellsMichael D. Delorise Shinerigby, DO  [facility] [place] [phone]  Darien RamusBrittany Lauren Abrigo - 26 y.o. female MRN [id number]  Date of birth: 1991-05-15  Visit Date: 10/28/2016  PCP: SwazilandJordan, Betty G, MD   Referred by: SwazilandJordan, Betty G, MD  Clovis CaoAutumn McNeil, cma acting as scribe for Dr. Berline Choughigby.  SUBJECTIVE:   Chief Complaint  Patient presents with  . NP: Left Shoulder Pain   HPI: As below and per problem based documentation when appropriate.  GrenadaBrittany reports with acute on chronic Lt shoulder pain. Initally started in college when she would play tennis--she noticed a grinding sensation w/o pain. Last 5-6 months pain has gradually worsened with specific activity. With severe pain there is radiation to elbow. Yesterday pain was 9/10. Was prescribed Celebrex from PCP on Tuesday, day one--2 tablets and day two--1 tablet with no relief. Took aleve 2 tablets and left over hydrocodone 5-325mg  1 tablet last pm and this morning with relief. Xray performed Tuesday with normal results.     Review of Systems  Constitutional: Negative.   HENT: Negative.   Eyes: Negative.   Cardiovascular: Negative.   Gastrointestinal: Negative.   Genitourinary: Negative.   Musculoskeletal: Positive for joint pain.  Skin: Negative.   Neurological: Negative.   Endo/Heme/Allergies: Negative.   Psychiatric/Behavioral: Negative.     Otherwise per HPI.  HISTORY & PERTINENT PRIOR DATA:  No specialty comments available. She reports that she has never smoked. She has never used smokeless tobacco. No results for input(s): HGBA1C, LABURIC in the last 8760 hours. Medications & Allergies reviewed per EMR Patient Active Problem List   Diagnosis Date Noted  . Left shoulder pain 11/02/2016  . Chronic upper back pain 08/03/2016  . Headache, tension type, chronic 08/03/2016  . Depression, major, in partial remission (HCC) 08/03/2016   Past Medical History:  Diagnosis Date    . Anxiety   . Depression   . Frequent headaches   . History of frequent urinary tract infections    Family History  Problem Relation Age of Onset  . Heart disease Maternal Grandfather    No past surgical history on file. Social History   Occupational History  . Not on file.   Social History Main Topics  . Smoking status: Never Smoker  . Smokeless tobacco: Never Used  . Alcohol use No  . Drug use: No  . Sexual activity: Not on file    OBJECTIVE:  VS:  HT:5\' 8"  (172.7 cm)   WT:154 lb 6.4 oz (70 kg)  BMI:23.5    BP:100/70  HR:91bpm  TEMP: ( )  RESP:99 % Physical Exam  Constitutional: She appears well-developed and well-nourished. She is cooperative.  Non-toxic appearance. No distress.  HENT:  Head: Normocephalic and atraumatic.  Cardiovascular: Intact distal pulses.   Pulmonary/Chest: No accessory muscle usage. No respiratory distress.  Neurological: She is alert. She is not disoriented. No sensory deficit.  Skin: Skin is warm, dry and intact. Capillary refill takes less than 2 seconds. No abrasion and no rash noted.  Psychiatric: She has a normal mood and affect. Her speech is normal and behavior is normal. Thought content normal.   Left Shoulder Exam   Tenderness  The patient is experiencing tenderness in the biceps tendon, acromion.  Comments:  Marked limitation in external rotation at 30 of abduction.  Pain with IR, ER actively and passively.  Pain with empty can testing.  Weakness with empty can testing at 4/5.  IMAGING & PROCEDURES: Dg Shoulder Left  Result Date: 10/26/2016 CLINICAL DATA:  Limited range of motion and pain common no known injury, initial encounter EXAM: LEFT SHOULDER - 2+ VIEW COMPARISON:  None. FINDINGS: There is no evidence of fracture or dislocation. There is no evidence of arthropathy or other focal bone abnormality. Soft tissues are unremarkable. IMPRESSION: No acute abnormality noted. Electronically Signed   By: Alcide Clever M.D.    On: 10/26/2016 16:16   Findings:   PROCEDURE NOTE -  ULTRASOUND GUIDEDINJECTION: Left intra-articular shoulder Images were obtained and interpreted by myself, Gaspar Bidding, DO  Images have been saved and stored to PACS system. Images obtained on: GE S7 Ultrasound machine  ULTRASOUND FINDINGS:  Biceps Tendon: Normal Pec Major Insertion: Normal Subscapularis Tendon: Mild degenerative fraying and thickening but no focal tearing. Supraspinatus Tendon: Degenerative split with small minimally retracted anterior fiber tear. Infraspinatus/Teres Minor Tendon: Normal AC Joint: Normal JOINT: No significant GH spurring appreciated LABRUM: No tear appreciated but limited diagnostic reliability due to technical aspects of MSK ultrasound  DESCRIPTION OF PROCEDURE:  The patient's clinical condition is marked by substantial pain and/or significant functional disability. Other conservative therapy has not provided relief, is contraindicated, or not appropriate. There is a reasonable likelihood that injection will significantly improve the patient's pain and/or functional impairment. After discussing the risks, benefits and expected outcomes of the injection and all questions were reviewed and answered, the patient wished to undergo the above named procedure. Verbal consent was obtained. The ultrasound was used to identify the target structure and adjacent neurovascular structures. The skin was then prepped in sterile fashion and the target structure was injected under direct visualization using sterile technique as below: PREP: Alcohol, Ethel Chloride APPROACH:  posterior, stopcock technique 22g 3.5" needle INJECTATE: 5cc 1% lidocaine, 3cc 0.5% marcaine, 2cc 40mg  DepoMedrol ASPIRATE: N/A DRESSING: Band-Aid  Post procedural instructions including recommending icing and warning signs for infection were reviewed. This procedure was well tolerated and there were no complications.   IMPRESSION:  Succesful US Guided Injection        ASSESSMENT & PLAN:   Problem List Items Addressed This Visit    Left shoulder pain    Intrasubstance tearing of the anterior fibers of the supraspinatus with associated adhesive capsulitis. No significant retraction appreciated. Large-volume intra-articular injection performed today with referral to physical therapy to begin working on shoulder range of motion and mobilization.  If any lack of improvement further diagnostic evaluation with MR arthrogram will be recommended.  Otherwise we will plan to check in with her in 6 weeks and see how she is progressing.      Relevant Orders   [place] LIMITED JOINT SPACE STRUCTURES UP LEFT(NO LINKED CHARGES)    Other Visit Diagnoses    Adhesive capsulitis of left shoulder    -  Primary   Relevant Orders   Ambulatory referral to Physical Therapy       Follow-up: Return in about 6 weeks (around 12/09/2016).  Otherwise please see problem oriented charting as below.  CMA/ATC served as Neurosurgeon during this visit. History, Physical, and Plan performed by medical provider. Documentation and orders reviewed and attested to.      Gaspar Bidding, DO    [place] Physician    11/02/2016 12:18 PM

## 2016-10-28 NOTE — Telephone Encounter (Signed)
Called and spoke with patient. She stated the pain is very bad, and she stayed home from work today. Advised her that we had an appointment for her to see sports medicine tomorrow, but the pain is very bad. I advised her to call the office and see if it could be moved any sooner. Patient has an appointment today at 10:30. Nothing further needed.

## 2016-10-29 ENCOUNTER — Ambulatory Visit: Payer: Self-pay | Admitting: Family Medicine

## 2016-10-29 ENCOUNTER — Ambulatory Visit: Payer: No Typology Code available for payment source | Admitting: Sports Medicine

## 2016-11-01 ENCOUNTER — Ambulatory Visit (INDEPENDENT_AMBULATORY_CARE_PROVIDER_SITE_OTHER): Payer: No Typology Code available for payment source | Admitting: Physical Therapy

## 2016-11-01 DIAGNOSIS — M25612 Stiffness of left shoulder, not elsewhere classified: Secondary | ICD-10-CM

## 2016-11-01 DIAGNOSIS — R293 Abnormal posture: Secondary | ICD-10-CM

## 2016-11-01 DIAGNOSIS — M25512 Pain in left shoulder: Secondary | ICD-10-CM

## 2016-11-01 NOTE — Therapy (Signed)
[facility] [address], [place], [address] Phone: [phone]   Fax:  [phone]  Physical Therapy Evaluation  Patient Details  Name: Caitlyn Munoz MRN: [id number] Date of Birth: 05-Nov-1990 Referring Provider: Dr. Gaspar Bidding  Encounter Date: 11/01/2016      PT End of Session - 11/01/16 1957    Visit Number 1   Number of Visits 12   Date for PT Re-Evaluation 12/13/16   Authorization Type Med Cost    Authorization - Number of Visits 30   PT Start Time 1600   PT Stop Time 1639   PT Time Calculation (min) 39 min   Activity Tolerance Patient tolerated treatment well   Behavior During Therapy [facility] for tasks assessed/performed      Past Medical History:  Diagnosis Date  . Anxiety   . Depression   . Frequent headaches   . History of frequent urinary tract infections     No past surgical history on file.  There were no vitals filed for this visit.       Subjective Assessment - 11/01/16 1604    Subjective Pt is a 26 y/o female who presents to OPPT with 5 month hx of Lt shoulder pain mainly with internal rotation/abduction.  Only activity she can recall was increase in climbing.  Pt reports about 1 week ago reached down from bed and had excruciating pain in LUE.  Pt went to MD last week, had injection on Thursday 10/28/16, and reports some improvement in UE since Saturday.  Pt presents today with continued difficulty with ROM.   Pertinent History anxiety, depression   Limitations House hold activities   Patient Stated Goals improve ROM, pain; regain use of LUE, return to rock climbing   Currently in Pain? Yes   Pain Score 0-No pain  since injection: up to 7/10   Pain Location Shoulder   Pain Orientation Left   Pain Descriptors / Indicators Shooting;Stabbing;Sharp;Sore   Pain Type Acute pain   Pain Onset 1 to 4 weeks ago   Pain Frequency Intermittent   Aggravating Factors  any motion   Pain Relieving  Factors injection, aleve   Effect of Pain on Daily Activities unable to sleep on Lt side            [facility] PT Assessment - 11/01/16 1609      Assessment   Medical Diagnosis Lt shoulder adhesive capsulitis   Referring Provider Dr. Gaspar Bidding   Onset Date/Surgical Date 10/25/16   Hand Dominance Right   Next MD Visit 12/10/16   Prior Therapy unrelated to this condition     Precautions   Precautions None     Restrictions   Weight Bearing Restrictions No     Balance Screen   Has the patient fallen in the past 6 months No   Has the patient had a decrease in activity level because of a fear of falling?  No   Is the patient reluctant to leave their home because of a fear of falling?  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   Leisure rock  climbing, Medical laboratory scientific officermusic-play guitar, go to concerts     Cognition   Overall Cognitive Status Within Functional Limits for tasks assessed     Posture/Postural Control   Posture/Postural Control Postural limitations   Postural Limitations Rounded Shoulders;Forward head     ROM / Strength   AROM / PROM / Strength AROM;Strength;PROM     AROM   AROM Assessment Site Shoulder   Right/Left Shoulder Left   Left Shoulder Flexion 48 Degrees   Left Shoulder ABduction 32 Degrees   Left Shoulder Internal Rotation 78 Degrees  FIR to L5/S1   Left Shoulder External Rotation 14 Degrees     PROM   PROM Assessment Site Shoulder   Right/Left Shoulder Left   Left Shoulder Flexion 106 Degrees   Left Shoulder ABduction 155 Degrees   Left Shoulder External Rotation 22 Degrees     Strength   Overall  Strength Comments not formally tested but pain with resisted abduction and external rotation     Palpation   Palpation comment tenderness along Lt supraspinatus, and teres minor/infraspinatus                   [facility] Adult PT Treatment/Exercise - 11/01/16 1609      Posture/Postural Control   Posture Comments protracted scapulae bil     Exercises   Exercises Shoulder     Shoulder Exercises: Supine   External Rotation Left;5 reps;AAROM   External Rotation Limitations with cane   Flexion Left;5 reps;AAROM   Flexion Limitations with cane   ABduction AAROM;Left;5 reps   ABduction Limitations with cane     Shoulder Exercises: Stretch   Internal Rotation Stretch 1 rep   Internal Rotation Stretch Limitations 30 sec; with towel                PT Education - 11/01/16 1956    Education provided Yes   Education Details AA HEP   Person(s) Educated Patient   Methods Explanation;Demonstration;Handout   Comprehension Verbalized understanding;Returned demonstration             PT Long Term Goals - 11/01/16 2000      PT LONG TERM GOAL #1   Title independent with HEP (12/13/16)   Time 6   Period Weeks   Status New     PT LONG TERM GOAL #2   Title verbalize understanding of posture and body mechanics to decrease risk of reinjury (12/13/16)   Time 6   Period Weeks   Status New     PT LONG TERM GOAL #3   Title improve Lt shoulder AROM to WNL for improved function and mobility (12/13/16)   Time 6   Period Weeks   Status New     PT LONG TERM GOAL #4   Title report no increase in pain at end of work day for return to regular work responsibilities (12/13/16)   Time 6   Period Weeks   Status New     PT LONG TERM GOAL #5   Title report independence with ADLs without compensation for improved LUE use (12/13/16)   Time 6   Period Weeks   Status New               Plan - 11/01/16 1957    Clinical Impression Statement Pt is a 26 y/o female who presents  to OPPT for low complexity PT eval for Lt shoulder adhesive capsulitis.  Pt had injection Thursday 5/3, and reports small improvement in pain and ROM.  AROM continues to be limited, but PROM near full ROM except external rotation.  HEP established today to maximize ROM gains and will incorporate strengthening and postural reeducation to decrease risk of reinjury.  Will benefit from PT to address deficits listed.   Rehab Potential Good   PT Frequency 2x / week   PT Duration 6 weeks   PT Treatment/Interventions ADLs/Self Care Home Management;Cryotherapy;Electrical Stimulation;Moist Heat;Ultrasound;Iontophoresis 4mg /ml Dexamethasone;Therapeutic exercise;Therapeutic activities;Patient/family education;Dry needling;Taping;Vasopneumatic Device;Manual techniques;Passive range of motion   PT Next Visit Plan review HEP, manual for ROM, initiate isometrics and rockwood 4 strengthening, modalities PRN   Consulted and Agree with Plan of Care Patient      Patient will benefit from skilled therapeutic intervention in order to improve the following deficits and impairments:  Postural dysfunction, Pain, Impaired UE functional use, Increased fascial restricitons, Decreased strength, Decreased range of motion  Visit Diagnosis: Acute pain of left shoulder - Plan: PT plan of care cert/re-cert  Abnormal posture - Plan: PT plan of care cert/re-cert  Stiffness of left shoulder, not elsewhere classified - Plan: PT plan of care cert/re-cert     Problem List Patient Active Problem List   Diagnosis Date Noted  . Chronic upper back pain 08/03/2016  . Headache, tension type, chronic 08/03/2016  . Depression, major, in partial remission (HCC) 08/03/2016      Clarita Crane, PT, DPT 11/01/16 8:05 PM    [facility] [address] [place], [place], [address] Phone: [phone]   Fax:  [phone]  Name: Caitlyn Munoz MRN: [id number] Date of  Birth: 1990-07-09

## 2016-11-01 NOTE — Patient Instructions (Signed)
SHOULDER: External Rotation - Supine (Cane)   Hold cane with both hands. Rotate arm away from body. Keep elbow on floor and next to body. _10-15__ reps per set, __2-3_ sets per day, __7_ days per week Add towel to keep elbow at side.  Copyright  VHI. All rights reserved.    Cane Exercise: Flexion   Lie on back, holding cane above chest. Keeping arms as straight as possible, lower cane toward floor beyond head. Hold __1-2__ seconds. Repeat __10-15__ times. Do __2-3__ sessions per day.  http://gt2.exer.us/91   Copyright  VHI. All rights reserved.   Cane Exercise: Abduction    Hold cane with right hand over end, palm-up, with other hand palm-down. Move arm out from side and up by pushing with other arm. Hold _1-2___ seconds. Repeat __10-15__ times. Do __2-3__ sessions per day.  http://gt2.exer.us/82   Copyright  VHI. All rights reserved.    ROM: Towel Stretch - with Interior Rotation    Pull left arm up behind back by pulling towel up with other arm. Hold __10-20__ seconds. Repeat _2-3___ times per set. Do __1__ sets per session. Do _2-3___ sessions per day.  http://orth.exer.us/888   Copyright  VHI. All rights reserved.

## 2016-11-02 ENCOUNTER — Encounter: Payer: Self-pay | Admitting: Sports Medicine

## 2016-11-02 DIAGNOSIS — M25512 Pain in left shoulder: Secondary | ICD-10-CM | POA: Insufficient documentation

## 2016-11-02 NOTE — Assessment & Plan Note (Signed)
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 No significant retraction appreciated. Large-volume intra-articular injection performed today with referral to physical therapy to begin working on shoulder range of motion and mobilization.  If any lack of improvement further diagnostic evaluation with MR arthrogram will be recommended.  Otherwise we will plan to check in with her in 6 weeks and see how she is progressing.

## 2016-11-04 ENCOUNTER — Ambulatory Visit (INDEPENDENT_AMBULATORY_CARE_PROVIDER_SITE_OTHER): Payer: No Typology Code available for payment source | Admitting: Physical Therapy

## 2016-11-04 DIAGNOSIS — M25512 Pain in left shoulder: Secondary | ICD-10-CM

## 2016-11-04 DIAGNOSIS — R293 Abnormal posture: Secondary | ICD-10-CM | POA: Diagnosis not present

## 2016-11-04 DIAGNOSIS — M25612 Stiffness of left shoulder, not elsewhere classified: Secondary | ICD-10-CM | POA: Diagnosis not present

## 2016-11-04 NOTE — Therapy (Signed)
[facility] [address] [place], [place], [address] Phone: [phone]   Fax:  [phone]  Physical Therapy Treatment  Patient Details  Name: Caitlyn Munoz MRN: [id number] Date of Birth: May 14, 1991 Referring Provider: Dr. Gaspar Bidding  Encounter Date: 11/04/2016      PT End of Session - 11/04/16 1641    Visit Number 2   Number of Visits 12   Date for PT Re-Evaluation 12/13/16   Authorization Type Med Cost    Authorization - Number of Visits 30   PT Start Time 1558   PT Stop Time 1638   PT Time Calculation (min) 40 min   Activity Tolerance Patient tolerated treatment well   Behavior During Therapy [facility] for tasks assessed/performed      Past Medical History:  Diagnosis Date  . Anxiety   . Depression   . Frequent headaches   . History of frequent urinary tract infections     No past surgical history on file.  There were no vitals filed for this visit.      Subjective Assessment - 11/04/16 1602    Subjective Lt shoulder feels much better; and motion is improving.  Feels like each day is getting better.   Patient Stated Goals improve ROM, pain; regain use of LUE, return to rock climbing   Currently in Pain? Yes   Pain Score 3    Pain Location Shoulder   Pain Orientation Left   Pain Descriptors / Indicators Aching;Dull   Pain Type Acute pain   Pain Onset 1 to 4 weeks ago   Pain Frequency Intermittent   Aggravating Factors  any motion (still flexion)   Pain Relieving Factors injection, Dom[facility] Made[facility] Adult PT Treatment/Exercise - 11/04/16 1605      Shoulder Exercises: Supine   External Rotation AAROM;Left;10 reps   External Rotation Limitations with cane   Internal Rotation Left;10 reps;AAROM   Internal Rotation Limitations standing with UE ranger   Flexion AAROM;Left;10 reps   Flexion Limitations with cane   ABduction AAROM;Left;15 reps   ABduction Limitations  with cane     Shoulder Exercises: Standing   External Rotation Left;15 reps;Theraband   Theraband Level (Shoulder External Rotation) Level 2 (Red)   Internal Rotation Left;15 reps;Theraband   Theraband Level (Shoulder Internal Rotation) Level 2 (Red)   Flexion Left;15 reps;Theraband   Theraband Level (Shoulder Flexion) Level 2 (Red)   Flexion Limitations "punch" motion   Extension Both;15 reps;Theraband   Theraband Level (Shoulder Extension) Level 2 (Red)   Row Both;15 reps;Theraband   Theraband Level (Shoulder Row) Level 2 (Red)     Shoulder Exercises: Therapy Ball   Flexion 10 reps   Flexion Limitations 5 sec hold     Shoulder Exercises: ROM/Strengthening   Wall Wash flexion and circles x 15 reps each; 1# wrist weight     Shoulder Exercises: Stretch   Internal Rotation Stretch 2 reps   Internal Rotation Stretch Limitations 30 sec; with towel                PT Education - 11/04/16 1640    Education provided Yes   Education Details theraband HEP   Person(s) Educated Patient   Methods Explanation;Demonstration;Handout   Comprehension Verbalized understanding;Returned demonstration             PT Long Term Goals - 11/01/16  2000      PT LONG TERM GOAL #1   Title independent with HEP (12/13/16)   Time 6   Period Weeks   Status New     PT LONG TERM GOAL #2   Title verbalize understanding of posture and body mechanics to decrease risk of reinjury (12/13/16)   Time 6   Period Weeks   Status New     PT LONG TERM GOAL #3   Title improve Lt shoulder AROM to WNL for improved function and mobility (12/13/16)   Time 6   Period Weeks   Status New     PT LONG TERM GOAL #4   Title report no increase in pain at end of work day for return to regular work responsibilities (12/13/16)   Time 6   Period Weeks   Status New     PT LONG TERM GOAL #5   Title report independence with ADLs without compensation for improved LUE use (12/13/16)   Time 6   Period Weeks    Status New               Plan - 11/04/16 1641    Clinical Impression Statement Pt with improved AROM today with only c/o pain with forward flexion and at 90 degrees abduction, which resolves quickly.  Tolerated strengthening exercises well today and will continue to benefit from PT to maximize function and improve pain.  Pain with some exercises today which resolved after activity.   PT Treatment/Interventions ADLs/Self Care Home Management;Cryotherapy;Electrical Stimulation;Moist Heat;Ultrasound;Iontophoresis 4mg /ml Dexamethasone;Therapeutic exercise;Therapeutic activities;Patient/family education;Dry needling;Taping;Vasopneumatic Device;Manual techniques;Passive range of motion   PT Next Visit Plan review HEP, manual PRN, continue RTC strengthening and scap stabilizing, modalities PRN   Consulted and Agree with Plan of Care Patient      Patient will benefit from skilled therapeutic intervention in order to improve the following deficits and impairments:  Postural dysfunction, Pain, Impaired UE functional use, Increased fascial restricitons, Decreased strength, Decreased range of motion  Visit Diagnosis: Acute pain of left shoulder  Abnormal posture  Stiffness of left shoulder, not elsewhere classified     Problem List Patient Active Problem List   Diagnosis Date Noted  . Left shoulder pain 11/02/2016  . Chronic upper back pain 08/03/2016  . Headache, tension type, chronic 08/03/2016  . Depression, major, in partial remission (HCC) 08/03/2016      Caitlyn Munoz, PT, DPT 11/04/16 4:44 PM    [facility] [address] [address] [place], [place], [address] Phone: [phone]   Fax:  [phone]  Name: Caitlyn Munoz MRN: [id number] Date of Birth: 06-19-1991

## 2016-11-04 NOTE — Patient Instructions (Signed)
Scapular Retraction: Rowing (Eccentric) - Arms - Side (Resistance Band)    Hold end of band in each hand. Pull back until elbows are even with trunk. Keep elbows by sides, thumbs up. Hold for 3-5 seconds. Use __red___ resistance band. _15__ reps per set, _2-3__ sets per day, _7__ days per week.   http://ecce.exer.us/227   Copyright  VHI. All rights reserved.    Strengthening: Resisted Internal Rotation   Hold tubing in left hand, elbow at side and forearm out. Rotate forearm in across body. Repeat __15__ times per set. Do _1___ sets per session. Do _2-3___ sessions per day.  http://orth.exer.us/830   Copyright  VHI. All rights reserved.             Strengthening: Resisted External Rotation   Hold tubing in left hand, elbow at side and forearm across body. Rotate forearm out. Repeat __15__ times per set. Do __1__ sets per session. Do __2-3__ sessions per day.  http://orth.exer.us/828   Copyright  VHI. All rights reserved.  Strengthening: Resisted Flexion   Hold tubing with left arm at side. Pull forward and up. Move shoulder through pain-free range of motion. Repeat _15___ times per set. Do __1__ sets per session. Do _2-3___ sessions per day.  http://orth.exer.us/824   Copyright  VHI. All rights reserved.             Strengthening: Resisted Extension   Hold tubing in both hands, arm forward. Pull arm back, elbow straight. Repeat __15__ times per set. Do __1__ sets per session. Do _2-3___ sessions per day.  http://orth.exer.us/832   Copyright  VHI. All rights reserved.

## 2016-11-12 ENCOUNTER — Ambulatory Visit (INDEPENDENT_AMBULATORY_CARE_PROVIDER_SITE_OTHER): Payer: No Typology Code available for payment source | Admitting: Physical Therapy

## 2016-11-12 DIAGNOSIS — M25612 Stiffness of left shoulder, not elsewhere classified: Secondary | ICD-10-CM

## 2016-11-12 DIAGNOSIS — R293 Abnormal posture: Secondary | ICD-10-CM

## 2016-11-12 DIAGNOSIS — M25512 Pain in left shoulder: Secondary | ICD-10-CM | POA: Diagnosis not present

## 2016-11-12 NOTE — Therapy (Signed)
[facility] [address] [address] [place], [place], [address] Phone: [phone]   Fax:  [phone]  Physical Therapy Treatment  Patient Details  Name: Caitlyn Munoz MRN: [id number] Date of Birth: September 23, 1990 Referring Provider: Dr. Gaspar BiddingMichael Rigby  Encounter Date: 11/12/2016      PT End of Session - 11/12/16 0803    Visit Number 3   Number of Visits 12   Date for PT Re-Evaluation 12/13/16   Authorization Type Med Cost    Authorization - Number of Visits 30   PT Start Time 0803   PT Stop Time 0844   PT Time Calculation (min) 41 min   Activity Tolerance Patient tolerated treatment well   Behavior During Therapy [facility] for tasks assessed/performed      Past Medical History:  Diagnosis Date  . Anxiety   . Depression   . Frequent headaches   . History of frequent urinary tract infections     No past surgical history on file.  There were no vitals filed for this visit.      Subjective Assessment - 11/12/16 0803    Subjective Patient states that the only pain she is really getting is with turning the car wheel (left turn) and her arm comes across her body   Pertinent History anxiety, depression   Limitations House hold activities   Patient Stated Goals improve ROM, pain; regain use of LUE, return to rock climbing   Currently in Pain? No/denies  up to 5/10 with certain movements            [facility] PT Assessment - 11/12/16 0001      AROM   AROM Assessment Site Shoulder   Right/Left Shoulder Left   Left Shoulder Flexion 163 Degrees  100 deg standing   Left Shoulder ABduction 171 Degrees   Left Shoulder Internal Rotation 22 Degrees  full FIR to between shoulder blades   Left Shoulder External Rotation 55 Degrees                     [facility] Adult PT Treatment/Exercise - 11/12/16 0001      Shoulder Exercises: Supine   Other Supine Exercises thoracic extension over bolster     Shoulder Exercises: Seated    Other Seated Exercises Robber 1x10     Shoulder Exercises: Prone   Extension Strengthening;Left;20 reps  no wt   Horizontal ABduction 1 Strengthening;Left;20 reps  1x10 no wt; 1# 1 x 10   Horizontal ABduction 1 Limitations T; 2# too difficult   Horizontal ABduction 2 Strengthening;Left;20 reps;Limitations   Horizontal ABduction 2 Limitations Y; no wt   Other Prone Exercises row 4# 2x10     Shoulder Exercises: Sidelying   External Rotation Strengthening;Left;Weights;20 reps  reports fatigue with eccentric lowering   External Rotation Weight (lbs) 1     Manual Therapy   Manual Therapy Passive ROM;Soft tissue mobilization   Soft tissue mobilization to left pectorals   Passive ROM into flex, IR, ER                PT Education - 11/12/16 1256    Education provided Yes   Education Details HEP   Person(s) Educated Patient   Methods Explanation;Demonstration;Handout   Comprehension Verbalized understanding;Returned demonstration             PT Long Term Goals - 11/01/16 2000      PT LONG TERM GOAL #1   Title independent with HEP (12/13/16)   Time  6   Period Weeks   Status New     PT LONG TERM GOAL #2   Title verbalize understanding of posture and body mechanics to decrease risk of reinjury (12/13/16)   Time 6   Period Weeks   Status New     PT LONG TERM GOAL #3   Title improve Lt shoulder AROM to WNL for improved function and mobility (12/13/16)   Time 6   Period Weeks   Status New     PT LONG TERM GOAL #4   Title report no increase in pain at end of work day for return to regular work responsibilities (12/13/16)   Time 6   Period Weeks   Status New     PT LONG TERM GOAL #5   Title report independence with ADLs without compensation for improved LUE use (12/13/16)   Time 6   Period Weeks   Status New               Plan - 11/12/16 1257    Clinical Impression Statement Patient presents with improved ROM in supine but is still limited with  standing flexion and with varying angles of ABD and IR. She tolerated prone strengthening well today, but demonstrates weakness with the TE. Goals are ongoing.   PT Treatment/Interventions ADLs/Self Care Home Management;Cryotherapy;Electrical Stimulation;Moist Heat;Ultrasound;Iontophoresis 4mg /ml Dexamethasone;Therapeutic exercise;Therapeutic activities;Patient/family education;Dry needling;Taping;Vasopneumatic Device;Manual techniques;Passive range of motion   PT Next Visit Plan Continue manual PRN, continue RTC strengthening and scap stabilizing, modalities PRN   PT Home Exercise Plan prone T and Y   Consulted and Agree with Plan of Care Patient      Patient will benefit from skilled therapeutic intervention in order to improve the following deficits and impairments:  Postural dysfunction, Pain, Impaired UE functional use, Increased fascial restricitons, Decreased strength, Decreased range of motion  Visit Diagnosis: Acute pain of left shoulder  Stiffness of left shoulder, not elsewhere classified  Abnormal posture     Problem List Patient Active Problem List   Diagnosis Date Noted  . Left shoulder pain 11/02/2016  . Chronic upper back pain 08/03/2016  . Headache, tension type, chronic 08/03/2016  . Depression, major, in partial remission (HCC) 08/03/2016    Solon Palm PT 11/12/2016, 1:00 PM   [facility] [address] [place], [place], [address] Phone: [phone]   Fax:  [phone]  Name: Caitlyn Munoz MRN: [id number] Date of Birth: 04-04-1991

## 2016-11-12 NOTE — Patient Instructions (Signed)
Abduction: Horizontal - Prone (Dumbbell)   Lie with right arm hanging down. Lift arm out to side, thumb up. Repeat _10___ times per set. Do __1-3__ sets per session. Do __1__ sessions per day. Use ____ lb weight.  Repeat with palm facing down toward floor.  Straight Arm Lift: Prone   Arm straight out diagonally. Keeping thumb up, lift arm. Keep pelvis still. You can add light weight. Do _10-30__ times, 1_ times per day.  Also perform with palm facing the floor.  ADD LIGHT WEIGHT (1-2#) WHEN NO WEIGHT BECOMES EASY AND NO PAIN.  Caitlyn Munoz, PT 11/12/16 8:39 AM [facility] [address] [place], [place], [address] Phone: [phone]   Fax:  [phone]

## 2016-11-15 ENCOUNTER — Ambulatory Visit (INDEPENDENT_AMBULATORY_CARE_PROVIDER_SITE_OTHER): Payer: No Typology Code available for payment source | Admitting: Physical Therapy

## 2016-11-15 DIAGNOSIS — M25512 Pain in left shoulder: Secondary | ICD-10-CM

## 2016-11-15 DIAGNOSIS — M25612 Stiffness of left shoulder, not elsewhere classified: Secondary | ICD-10-CM | POA: Diagnosis not present

## 2016-11-15 DIAGNOSIS — R293 Abnormal posture: Secondary | ICD-10-CM | POA: Diagnosis not present

## 2016-11-15 NOTE — Therapy (Signed)
[facility] [address] [place], [place], [address] Phone: [phone]   Fax:  [phone]  Physical Therapy Treatment  Patient Details  Name: Caitlyn RamusBrittany Lauren Goltz MRN: [id number] Date of Birth: 03-Aug-1990 Referring Provider: Dr. Gaspar BiddingMichael Rigby  Encounter Date: 11/15/2016      PT End of Session - 11/15/16 1636    Visit Number 4   Number of Visits 12   Date for PT Re-Evaluation 12/13/16   Authorization Type Med Cost    Authorization - Number of Visits 30   PT Start Time 1558   PT Stop Time 1636   PT Time Calculation (min) 38 min   Activity Tolerance Patient tolerated treatment well   Behavior During Therapy [facility] for tasks assessed/performed      Past Medical History:  Diagnosis Date  . Anxiety   . Depression   . Frequent headaches   . History of frequent urinary tract infections     No past surgical history on file.  There were no vitals filed for this visit.      Subjective Assessment - 11/15/16 1557    Subjective driving is getting better.  reports almost no dull ache anymore but has some pain with internal rotation/abduction/flexion   Patient Stated Goals improve ROM, pain; regain use of LUE, return to rock climbing   Currently in Pain? No/denies                         [facility] Treatment/Exercise - 11/15/16 1602      Shoulder Exercises: Supine   Protraction Left;20 reps;Weights   Protraction Weight (lbs) 5   Other Supine Exercises Lt extension with red theraband x 20     Shoulder Exercises: Prone   Retraction Left;20 reps;Weights   Retraction Weight (lbs) 5   Flexion Left;20 reps;Weights   Flexion Weight (lbs) 1   Flexion Limitations "Y"   Extension Strengthening;Left;20 reps;Weights   Extension Weight (lbs) 1   Horizontal ABduction 1 Strengthening;Left;20 reps   Horizontal ABduction 1 Weight (lbs) 1     Shoulder Exercises: Sidelying   External Rotation  Strengthening;Left;Weights;20 reps   External Rotation Weight (lbs) 2   Internal Rotation Left;20 reps;Theraband   Theraband Level (Shoulder Internal Rotation) Level 2 (Red)     Shoulder Exercises: ROM/Strengthening   Wall Pushups 20 reps  counter height     Shoulder Exercises: Body Blade   Flexion 30 seconds;1 rep   Flexion Limitations bil hold   ABduction 30 seconds;1 rep   External Rotation 30 seconds;1 rep                     PT Long Term Goals - 11/01/16 2000      PT LONG TERM GOAL #1   Title independent with HEP (12/13/16)   Time 6   Period Weeks   Status New     PT LONG TERM GOAL #2   Title verbalize understanding of posture and body mechanics to decrease risk of reinjury (12/13/16)   Time 6   Period Weeks   Status New     PT LONG TERM GOAL #3   Title improve Lt shoulder AROM to WNL for improved function and mobility (12/13/16)   Time 6   Period Weeks   Status New     PT LONG TERM GOAL #4   Title report no increase in pain at end of work day for return to regular work responsibilities (  12/13/16)   Time 6   Period Weeks   Status New     PT LONG TERM GOAL #5   Title report independence with ADLs without compensation for improved LUE use (12/13/16)   Time 6   Period Weeks   Status New               Plan - 11/15/16 1636    Clinical Impression Statement Continues to improve with functional mobility and decreasing pain.  Pain now limited to internal rotation in flexion and abduction planes.  Will continue to benefit from PT to maximize strength and decrease risk of reinjury.   PT Treatment/Interventions ADLs/Self Care Home Management;Cryotherapy;Electrical Stimulation;Moist Heat;Ultrasound;Iontophoresis 4mg /ml Dexamethasone;Therapeutic exercise;Therapeutic activities;Patient/family education;Dry needling;Taping;Vasopneumatic Device;Manual techniques;Passive range of motion   PT Next Visit Plan Continue manual PRN, continue RTC strengthening and  scap stabilizing, modalities PRN   PT Home Exercise Plan prone T and Y   Consulted and Agree with Plan of Care Patient      Patient will benefit from skilled therapeutic intervention in order to improve the following deficits and impairments:  Postural dysfunction, Pain, Impaired UE functional use, Increased fascial restricitons, Decreased strength, Decreased range of motion  Visit Diagnosis: Acute pain of left shoulder  Stiffness of left shoulder, not elsewhere classified  Abnormal posture     Problem List Patient Active Problem List   Diagnosis Date Noted  . Left shoulder pain 11/02/2016  . Chronic upper back pain 08/03/2016  . Headache, tension type, chronic 08/03/2016  . Depression, major, in partial remission (HCC) 08/03/2016      Clarita Crane, PT, DPT 11/15/16 4:38 PM    [facility] [address] [place], [place], [address] Phone: [phone]   Fax:  [phone]  Name: Caitlyn Munoz MRN: [id number] Date of Birth: [birth year]

## 2016-11-17 ENCOUNTER — Ambulatory Visit (INDEPENDENT_AMBULATORY_CARE_PROVIDER_SITE_OTHER): Payer: No Typology Code available for payment source | Admitting: Physical Therapy

## 2016-11-17 DIAGNOSIS — M25612 Stiffness of left shoulder, not elsewhere classified: Secondary | ICD-10-CM

## 2016-11-17 DIAGNOSIS — R293 Abnormal posture: Secondary | ICD-10-CM | POA: Diagnosis not present

## 2016-11-17 DIAGNOSIS — M25512 Pain in left shoulder: Secondary | ICD-10-CM

## 2016-11-17 NOTE — Therapy (Signed)
[facility] [address] [place], [place], [address] Phone: [phone]   Fax:  [phone]  Physical Therapy Treatment  Patient Details  Name: Latashia Koch MRN: [id number] Date of Birth: Nov 24, 1990 Referring Provider: Dr. Gaspar Bidding  Encounter Date: 11/17/2016      PT End of Session - 11/17/16 1640    Visit Number 5   Number of Visits 12   Date for PT Re-Evaluation 12/13/16   Authorization Type Med Cost    Authorization - Number of Visits 30   PT Start Time 1602   PT Stop Time 1645   PT Time Calculation (min) 43 min   Activity Tolerance Patient tolerated treatment well   Behavior During Therapy [facility] for tasks assessed/performed      Past Medical History:  Diagnosis Date  . Anxiety   . Depression   . Frequent headaches   . History of frequent urinary tract infections     No past surgical history on file.  There were no vitals filed for this visit.      Subjective Assessment - 11/17/16 1608    Subjective no pain today, feels the internal rotation is getting better; some soreness after last session   Patient Stated Goals improve ROM, pain; regain use of LUE, return to rock climbing   Currently in Pain? No/denies            [facility] PT Assessment - 11/17/16 1615      AROM   Right/Left Shoulder Left   Left Shoulder Flexion 161 Degrees  155 standing   Left Shoulder ABduction 180 Degrees  165 standing   Left Shoulder Internal Rotation 85 Degrees   Left Shoulder External Rotation 55 Degrees  70 after manual     PROM   Left Shoulder External Rotation 65 Degrees                     [facility] Adult PT Treatment/Exercise - 11/17/16 1609      Shoulder Exercises: Prone   Retraction Left;20 reps;Weights   Retraction Weight (lbs) 5   Flexion Left;20 reps;Weights   Flexion Weight (lbs) 1   Flexion Limitations "Y"   Extension Strengthening;Left;20 reps;Weights   Extension Weight (lbs) 2   Horizontal ABduction 1 Strengthening;Left;20 reps   Horizontal ABduction 1 Weight (lbs) 2     Shoulder Exercises: Standing   External Rotation Left;20 reps;Theraband   Theraband Level (Shoulder External Rotation) Level 2 (Red)   Internal Rotation Left;20 reps;Theraband   Theraband Level (Shoulder Internal Rotation) Level 2 (Red)   Flexion Left;20 reps;Theraband   Theraband Level (Shoulder Flexion) Level 2 (Red)   Flexion Limitations "punch" motion   Extension Both;20 reps;Theraband   Theraband Level (Shoulder Extension) Level 2 (Red)   Row Both;20 reps;Theraband   Theraband Level (Shoulder Row) Level 2 (Red)   Row Limitations 5 sec hold                PT Education - 11/17/16 1633    Education provided Yes   Education Details AA external rotation in 60 and 90 deg abduction   Person(s) Educated Patient   Methods Explanation;Demonstration   Comprehension Verbalized understanding;Returned demonstration             PT Long Term Goals - 11/17/16 1643      PT LONG TERM GOAL #1   Title independent with HEP (12/13/16)   Status On-going     PT LONG TERM GOAL #2  Title verbalize understanding of posture and body mechanics to decrease risk of reinjury (12/13/16)   Status On-going     PT LONG TERM GOAL #3   Title improve Lt shoulder AROM to WNL for improved function and mobility (12/13/16)   Status On-going     PT LONG TERM GOAL #4   Title report no increase in pain at end of work day for return to regular work responsibilities (12/13/16)   Status Achieved     PT LONG TERM GOAL #5   Title report independence with ADLs without compensation for improved LUE use (12/13/16)   Status On-going               Plan - 11/17/16 1640    Clinical Impression Statement Pt continues to improve and ROM near normal limits.  External rotation continues to be most limited but improved with manual today.  Recommend she try rock climbing this week to see how shoulder responds.  If  all goes well, anticipate d/c next week.     PT Treatment/Interventions ADLs/Self Care Home Management;Cryotherapy;Electrical Stimulation;Moist Heat;Ultrasound;Iontophoresis 4mg /ml Dexamethasone;Therapeutic exercise;Therapeutic activities;Patient/family education;Dry needling;Taping;Vasopneumatic Device;Manual techniques;Passive range of motion   PT Next Visit Plan Continue manual PRN, continue RTC strengthening and scap stabilizing, modalities PRN   PT Home Exercise Plan prone T and Y      Patient will benefit from skilled therapeutic intervention in order to improve the following deficits and impairments:  Postural dysfunction, Pain, Impaired UE functional use, Increased fascial restricitons, Decreased strength, Decreased range of motion  Visit Diagnosis: Acute pain of left shoulder  Stiffness of left shoulder, not elsewhere classified  Abnormal posture     Problem List Patient Active Problem List   Diagnosis Date Noted  . Left shoulder pain 11/02/2016  . Chronic upper back pain 08/03/2016  . Headache, tension type, chronic 08/03/2016  . Depression, major, in partial remission (HCC) 08/03/2016      Clarita CraneStephanie F Aracelie Addis, PT, DPT 11/17/16 4:45 PM    [facility] [address] [address] [place], [place], [address] Phone: [phone]   Fax:  [phone]  Name: Darien RamusBrittany Lauren Carrick MRN: [id number] Date of Birth: Feb 09, 1991

## 2016-11-18 ENCOUNTER — Ambulatory Visit (INDEPENDENT_AMBULATORY_CARE_PROVIDER_SITE_OTHER): Payer: No Typology Code available for payment source | Admitting: Family Medicine

## 2016-11-18 ENCOUNTER — Encounter: Payer: Self-pay | Admitting: Family Medicine

## 2016-11-18 VITALS — BP 118/68 | HR 78 | Temp 98.0°F | Ht 68.0 in | Wt 152.8 lb

## 2016-11-18 DIAGNOSIS — F3341 Major depressive disorder, recurrent, in partial remission: Secondary | ICD-10-CM | POA: Diagnosis not present

## 2016-11-18 DIAGNOSIS — M542 Cervicalgia: Secondary | ICD-10-CM

## 2016-11-18 DIAGNOSIS — Z0000 Encounter for general adult medical examination without abnormal findings: Secondary | ICD-10-CM | POA: Diagnosis not present

## 2016-11-18 DIAGNOSIS — G8929 Other chronic pain: Secondary | ICD-10-CM | POA: Diagnosis not present

## 2016-11-18 LAB — CBC WITH DIFFERENTIAL/PLATELET
Basophils Absolute: 0 10*3/uL (ref 0.0–0.1)
Basophils Relative: 0.4 % (ref 0.0–3.0)
Eosinophils Absolute: 0 10*3/uL (ref 0.0–0.7)
Eosinophils Relative: 0.8 % (ref 0.0–5.0)
HCT: 39 % (ref 36.0–46.0)
Hemoglobin: 12.8 g/dL (ref 12.0–15.0)
Lymphocytes Relative: 35.1 % (ref 12.0–46.0)
Lymphs Abs: 1.5 10*3/uL (ref 0.7–4.0)
MCHC: 32.9 g/dL (ref 30.0–36.0)
MCV: 92.8 fl (ref 78.0–100.0)
Monocytes Absolute: 0.3 10*3/uL (ref 0.1–1.0)
Monocytes Relative: 6.1 % (ref 3.0–12.0)
Neutro Abs: 2.4 10*3/uL (ref 1.4–7.7)
Neutrophils Relative %: 57.6 % (ref 43.0–77.0)
Platelets: 255 10*3/uL (ref 150.0–400.0)
RBC: 4.2 Mil/uL (ref 3.87–5.11)
RDW: 14.2 % (ref 11.5–15.5)
WBC: 4.2 10*3/uL (ref 4.0–10.5)

## 2016-11-18 LAB — LIPID PANEL
Cholesterol: 165 mg/dL (ref 0–200)
HDL: 63.8 mg/dL (ref >39.00)
LDL Cholesterol: 89 mg/dL (ref 0–99)
NonHDL: 101.54
Total CHOL/HDL Ratio: 3
Triglycerides: 61 mg/dL (ref 0.0–149.0)
VLDL: 12.2 mg/dL (ref 0.0–40.0)

## 2016-11-18 LAB — COMPREHENSIVE METABOLIC PANEL
ALT: 10 U/L (ref 0–35)
AST: 14 U/L (ref 0–37)
Albumin: 4.4 g/dL (ref 3.5–5.2)
Alkaline Phosphatase: 56 U/L (ref 39–117)
BUN: 13 mg/dL (ref 6–23)
CO2: 29 mEq/L (ref 19–32)
Calcium: 9.4 mg/dL (ref 8.4–10.5)
Chloride: 103 mEq/L (ref 96–112)
Creatinine, Ser: 0.66 mg/dL (ref 0.40–1.20)
GFR: 113.15 mL/min (ref >60.00)
Glucose, Bld: 94 mg/dL (ref 70–99)
Potassium: 4.2 mEq/L (ref 3.5–5.1)
Sodium: 138 mEq/L (ref 135–145)
Total Bilirubin: 0.6 mg/dL (ref 0.2–1.2)
Total Protein: 7 g/dL (ref 6.0–8.3)

## 2016-11-18 MED ORDER — SERTRALINE HCL 50 MG PO TABS: 50.0000 mg | ORAL_TABLET | Freq: Every day | ORAL | 3 refills | Status: DC

## 2016-11-18 NOTE — Progress Notes (Signed)
Subjective:  Insurance claims handlerAmber Munoz, CMA, acting as scribe for Caitlyn Munoz.  Caitlyn Munoz is a 26 y.o. female and is here for a comprehensive physical exam.  CC:  Patient has no concerns or complaints today.  She sees Dr. Berline Choughigby for left shoulder pain.  She also has PT for this.  Medications and allergies have been updated.  HPI  Health Maintenance Due  Topic Date Due  . HIV Screening  08/24/2003  . TETANUS/TDAP  08/24/2007   PMHx, SurgHx, SocialHx, Medications, and Allergies were reviewed in the Visit Navigator and updated as appropriate.   Past Medical History:  Diagnosis Date  . Anxiety   . Depression   . Frequent headaches   . History of frequent urinary tract infections    History reviewed. No pertinent surgical history.  Family History  Problem Relation Age of Onset  . Heart disease Maternal Grandfather    Social History  Substance Use Topics  . Smoking status: Never Smoker  . Smokeless tobacco: Never Used  . Alcohol use No   Review of Systems:   Review of Systems  Constitutional: Negative for chills and fever.  HENT: Negative for congestion, ear pain and sore throat.   Eyes: Negative for blurred vision and pain.  Respiratory: Negative for cough and shortness of breath.   Cardiovascular: Negative for chest pain and palpitations.  Gastrointestinal: Negative for abdominal pain, nausea and vomiting.  Genitourinary: Negative for frequency.  Musculoskeletal: Positive for joint pain.       Left shoulder.  Skin: Negative for rash.  Neurological: Positive for headaches. Negative for dizziness and loss of consciousness.  Endo/Heme/Allergies: Does not bruise/bleed easily.  Psychiatric/Behavioral: Negative for depression. The patient is not nervous/anxious.     Objective:   BP 118/68   Pulse 78   Temp 98 F (36.7 C) (Oral)   Ht 5\' 8"  (1.727 m)   Wt 152 lb 12.8 oz (69.3 kg)   LMP 11/18/2016   SpO2 99%   BMI 23.23 kg/m   General Appearance:    Alert,  cooperative, no distress, appears stated age  Head:    Normocephalic, without obvious abnormality, atraumatic  Eyes:    PERRL, conjunctiva/corneas clear, EOM's intact, fundi benign, both eyes  Ears:    Normal TM's and external ear canals, both ears  Nose:   Nares normal, septum midline, mucosa normal, no drainage    or sinus tenderness  Throat:   Lips, mucosa, and tongue normal; teeth and gums normal  Neck:   Supple, symmetrical, trachea midline, no adenopathy; thyroid: no enlargement  Back:     Symmetric, no curvature, ROM normal, no CVA tenderness  Lungs:     Clear to auscultation bilaterally, respirations unlabored  Chest Wall:    No tenderness or deformity   Heart:    Regular rate and rhythm, S1 and S2 normal, no murmur, rub   or gallop  Breast Exam:    No tenderness, masses, or nipple abnormality  Abdomen:     Soft, non-tender, bowel sounds active all four quadrants, no masses, no organomegaly  Extremities:   Extremities normal, atraumatic, no cyanosis or edema  Pulses:   2+ and symmetric all extremities  Skin:   Skin color, texture, turgor normal, no rashes or lesions  Lymph nodes:   Cervical, supraclavicular, and axillary nodes normal  Neurologic:   CNII-XII intact, normal strength, sensation and reflexes throughout  Msk:   Neck with limited ROM in all directions, sits in head-forward position  Assessment/Plan:   Caitlyn Munoz was seen today for annual exam.  Diagnoses and all orders for this visit:  Encounter for routine history and physical exam in female patient -     CBC with Differential/Platelet -     Comprehensive metabolic panel -     Lipid panel  Recurrent major depressive disorder, in partial remission (HCC) Comments: Medication refill today. No concerns. Orders: -     sertraline (ZOLOFT) 50 MG tablet; Take 1 tablet (50 mg total) by mouth daily.  Chronic neck pain Comments: Patinet is already undergoing PT for her shoulder. Will add on. Orders: -     Ambulatory  referral to Physical Therapy   Patient Counseling:   [x]     Nutrition: Stressed importance of moderation in sodium/caffeine intake, saturated fat and cholesterol, caloric balance, sufficient intake of fresh fruits, vegetables, fiber, calcium, iron, and 1 mg of folate supplement per day (for females capable of pregnancy).   [x]      Stressed the importance of regular exercise.    [x]     Substance Abuse: Discussed cessation/primary prevention of tobacco, alcohol, or other drug use; driving or other dangerous activities under the influence; availability of treatment for abuse.    [x]      Injury prevention: Discussed safety belts, safety helmets, smoke detector, smoking near bedding or upholstery.    [x]      Sexuality: Discussed sexually transmitted diseases, partner selection, use of condoms, avoidance of unintended pregnancy  and contraceptive alternatives.    [x]     Dental health: Discussed importance of regular tooth brushing, flossing, and dental visits.   [x]      Health maintenance and immunizations reviewed. Please refer to Health maintenance section.   Helane Rima, DO [facility] [place]  [place] served as Neurosurgeon during this visit. History, Physical, and Plan performed by medical provider. The above documentation has been reviewed and is accurate and complete. Helane Rima, D.O.

## 2016-11-23 ENCOUNTER — Ambulatory Visit (INDEPENDENT_AMBULATORY_CARE_PROVIDER_SITE_OTHER): Payer: No Typology Code available for payment source | Admitting: Physical Therapy

## 2016-11-23 DIAGNOSIS — M25612 Stiffness of left shoulder, not elsewhere classified: Secondary | ICD-10-CM | POA: Diagnosis not present

## 2016-11-23 DIAGNOSIS — R293 Abnormal posture: Secondary | ICD-10-CM

## 2016-11-23 DIAGNOSIS — M25512 Pain in left shoulder: Secondary | ICD-10-CM

## 2016-11-23 NOTE — Therapy (Signed)
[place] [address] [place], [place], [address] Phone: [phone]   Fax:  [phone]  Physical Therapy Treatment  Patient Details  Name: Caitlyn Munoz MRN: [id number] Date of Birth: 12/23/1990 Referring Provider: Dr. Teresa Coombs  Encounter Date: 11/23/2016      PT End of Session - 11/23/16 1643    Visit Number 6   Number of Visits 12   Date for PT Re-Evaluation 12/13/16   Authorization Type Med Cost    Authorization - Number of Visits 30   PT Start Time 4462   PT Stop Time 1643   PT Time Calculation (min) 39 min   Activity Tolerance Patient tolerated treatment well   Behavior During Therapy [facility] for tasks assessed/performed      Past Medical History:  Diagnosis Date  . Anxiety   . Depression   . Frequent headaches   . History of frequent urinary tract infections     No past surgical history on file.  There were no vitals filed for this visit.      Subjective Assessment - 11/23/16 1605    Subjective shoulder is doing really well, went kayaking and had no pain.    Pertinent History anxiety, depression   Patient Stated Goals improve ROM, pain; regain use of LUE, return to rock climbing   Currently in Pain? No/denies                         [facility] Adult PT Treatment/Exercise - 11/23/16 1612      Shoulder Exercises: Supine   Protraction Left;20 reps;Weights   Protraction Weight (lbs) 5   Horizontal ABduction Both;20 reps;Theraband   Theraband Level (Shoulder Horizontal ABduction) Level 3 (Green)   External Rotation Both;20 reps;Theraband   Theraband Level (Shoulder External Rotation) Level 3 (Green)   External Rotation Limitations with scap squeeze   Flexion Left;20 reps;Weights   Shoulder Flexion Weight (lbs) 3     Shoulder Exercises: Sidelying   External Rotation Strengthening;Left;Weights;20 reps   External Rotation Weight (lbs) 3   Flexion --  attempted but stopped due to  pain   ABduction Left;20 reps;Weights   ABduction Weight (lbs) 2   ABduction Limitations 4 reps with 3#, then needed to decr to 2#     Shoulder Exercises: Standing   External Rotation Left;10 reps;Theraband   Theraband Level (Shoulder External Rotation) Level 3 (Green)   Internal Rotation Left;10 reps;Theraband   Theraband Level (Shoulder Internal Rotation) Level 3 (Green)   Flexion Left;5 reps   Theraband Level (Shoulder Flexion) Level 3 (Green)   Flexion Limitations "punch" motion; increased pain so stopped and recommend continued red theraband   Extension Both;10 reps;Theraband   Theraband Level (Shoulder Extension) Level 3 (Green)   Row Both;10 reps;Theraband   Theraband Level (Shoulder Row) Level 3 Nyoka Cowden)                PT Education - 11/23/16 1643    Education provided Yes   Education Details progressed to green theraband   Person(s) Educated Patient   Methods Explanation;Demonstration   Comprehension Verbalized understanding;Returned demonstration             PT Long Term Goals - 11/23/16 1643      PT LONG TERM GOAL #1   Title independent with HEP (12/13/16)   Baseline 11/23/16: independent with current HEP   Status On-going     PT LONG TERM GOAL #2   Title verbalize  understanding of posture and body mechanics to decrease risk of reinjury (12/13/16)   Status On-going     PT LONG TERM GOAL #3   Title improve Lt shoulder AROM to WNL for improved function and mobility (12/13/16)   Status Achieved     PT LONG TERM GOAL #4   Title report no increase in pain at end of work day for return to regular work responsibilities (12/13/16)   Status Achieved     PT LONG TERM GOAL #5   Title report independence with ADLs without compensation for improved LUE use (12/13/16)   Status Achieved               Plan - 11/23/16 1644    Clinical Impression Statement Pt has met all LTGs at this time, and has returned to regular activity without pain.  Still has some  pain with internal rotation in flexion plane, and reports continued weakness with increased resistance exercises.  At this time will plan to transition to new referral for neck pain, but will address shoulder PRN.     PT Treatment/Interventions ADLs/Self Care Home Management;Cryotherapy;Electrical Stimulation;Moist Heat;Ultrasound;Iontophoresis 57m/ml Dexamethasone;Therapeutic exercise;Therapeutic activities;Patient/family education;Dry needling;Taping;Vasopneumatic Device;Manual techniques;Passive range of motion   PT Next Visit Plan address shoulder PRN, assess neck pain and address referral   Consulted and Agree with Plan of Care Patient      Patient will benefit from skilled therapeutic intervention in order to improve the following deficits and impairments:  Postural dysfunction, Pain, Impaired UE functional use, Increased fascial restricitons, Decreased strength, Decreased range of motion  Visit Diagnosis: Acute pain of left shoulder  Stiffness of left shoulder, not elsewhere classified  Abnormal posture     Problem List Patient Active Problem List   Diagnosis Date Noted  . Left shoulder pain 11/02/2016  . Chronic upper back pain 08/03/2016  . Headache, tension type, chronic 08/03/2016  . Depression, major, in partial remission ([facility]umas 08/03/2016      SLaureen Abrahams PT, DPT 11/23/16 4:47 PM    [place] [address]Phone: [phone]  Fax:  [phone] Name: BNikeya MaximMRN: [id number] of Birth: 201-13-1992

## 2016-11-24 ENCOUNTER — Encounter: Payer: Self-pay | Admitting: Family Medicine

## 2016-11-25 ENCOUNTER — Ambulatory Visit (INDEPENDENT_AMBULATORY_CARE_PROVIDER_SITE_OTHER): Payer: No Typology Code available for payment source | Admitting: Physical Therapy

## 2016-11-25 DIAGNOSIS — R293 Abnormal posture: Secondary | ICD-10-CM | POA: Diagnosis not present

## 2016-11-25 DIAGNOSIS — M542 Cervicalgia: Secondary | ICD-10-CM | POA: Diagnosis not present

## 2016-11-25 NOTE — Therapy (Signed)
[facility] [address] [place], [place], [address] Phone: [phone]   Fax:  [phone]  Physical Therapy Treatment/Recertification  Patient Details  Name: Caitlyn Munoz MRN: [id number] Date of Birth: 1991-06-07 Referring Provider: Dr Helane Rima  Encounter Date: 11/25/2016      PT End of Session - 11/25/16 1644    Visit Number 7   Number of Visits 16   Date for PT Re-Evaluation 12/23/16   Authorization Type Med Cost    Authorization - Number of Visits 30   PT Start Time 1602   PT Stop Time 1640   PT Time Calculation (min) 38 min   Activity Tolerance Patient tolerated treatment well   Behavior During Therapy [facility] for tasks assessed/performed      Past Medical History:  Diagnosis Date  . Anxiety   . Depression   . Frequent headaches   . History of frequent urinary tract infections     No past surgical history on file.  There were no vitals filed for this visit.      Subjective Assessment - 11/25/16 1604    Subjective Shoulder is doing well, no pain and full ROM.  Presents today for evaluation and assessment of neck pain, reporting hx of pain since age 72.  Pt then went to chiropractor, but unsuccessful with intervention.  Neck pain reports pain is at base of head radiating into ram horn pattern.     Pertinent History anxiety, depression   Limitations House hold activities   Patient Stated Goals improve ROM, pain; regain use of LUE, return to rock climbing   Currently in Pain? --  no pain, just stiffness   Aggravating Factors  neck: running fast   Pain Relieving Factors neck: muscle relaxers, heating pad            [facility] PT Assessment - 11/25/16 1608      Assessment   Medical Diagnosis neck pain   Referring Provider Dr Helane Rima   Onset Date/Surgical Date --  12 years ago   Hand Dominance Right     Balance Screen   Has the patient fallen in the past 6 months No   Has the patient had a  decrease in activity level because of a fear of falling?  No   Is the patient reluctant to leave their home because of a fear of falling?  No     Home Environment   Living Environment Private residence     Posture/Postural Control   Posture/Postural Control Postural limitations   Postural Limitations Rounded Shoulders;Forward head     AROM   AROM Assessment Site Cervical   Cervical Flexion 30   Cervical Extension 50   Cervical - Right Side Bend 24   Cervical - Left Side Bend 13   Cervical - Right Rotation 41   Cervical - Left Rotation 68     Strength   Strength Assessment Site Shoulder;Elbow   Right/Left Shoulder Right;Left   Right Shoulder Flexion 5/5   Right Shoulder ABduction 5/5   Right Shoulder Internal Rotation 5/5   Right Shoulder External Rotation 5/5   Left Shoulder Flexion 4/5   Left Shoulder ABduction 4/5   Left Shoulder Internal Rotation 5/5   Left Shoulder External Rotation 4/5   Right/Left Elbow Right;Left   Right Elbow Flexion 5/5   Right Elbow Extension 5/5   Left Elbow Flexion 5/5   Left Elbow Extension 5/5     Palpation   Palpation comment  tightness in bil upper trap and bil cervical paraspinals     Special Tests    Special Tests Cervical   Cervical Tests Spurling's;Dictraction     Spurling's   Findings Negative     Distraction Test   Findngs Negative                     [facility] Adult PT Treatment/Exercise - 11/25/16 1608      Exercises   Exercises Neck     Neck Exercises: Supine   Neck Retraction 10 reps;5 secs     Manual Therapy   Manual Therapy Myofascial release   Myofascial Release instructed in use for cervical paraspinals, upper trap and levator scapula     Neck Exercises: Stretches   Upper Trapezius Stretch 2 reps;30 seconds   Upper Trapezius Stretch Limitations bil   Other Neck Stretches SCM stretch 2x30 sec bil                PT Education - 11/25/16 1644    Education provided Yes   Education Details  cervical HEP   Person(s) Educated Patient   Methods Explanation;Demonstration;Handout   Comprehension Verbalized understanding;Returned demonstration             PT Long Term Goals - 11/25/16 1645      PT LONG TERM GOAL #1   Title independent with HEP (12/23/16)   Time 4   Period Weeks   Status On-going     PT LONG TERM GOAL #2   Title verbalize understanding of posture and body mechanics to decrease risk of reinjury (12/23/16)   Status On-going     PT LONG TERM GOAL #3   Title improve Lt shoulder AROM to WNL for improved function and mobility (12/13/16)   Status Achieved     PT LONG TERM GOAL #4   Title report no increase in pain at end of work day for return to regular work responsibilities (12/13/16)   Status Achieved     PT LONG TERM GOAL #5   Title report independence with ADLs without compensation for improved LUE use (12/13/16)   Status Achieved     Additional Long Term Goals   Additional Long Term Goals Yes     PT LONG TERM GOAL #6   Title improve bil cervical rotation by at least 10 degrees for improved motion and IADLs (12/23/16)   Time 4   Period Weeks   Status New     PT LONG TERM GOAL #7   Title report 50% improvement in cervical tightness and pain for improved function (12/23/16)   Time 4   Period Weeks   Status New               Plan - 11/25/16 1647    Clinical Impression Statement Pt presents today with new referral for neck pain and limited motion.  Pt demonstrates decreased ROM in cervical spine as well as pain and tightness in cervical muscles.  Pt will benefit from PT to address these deficits.  Will plan to see 2x/wk x 4 weeks, and Lt shoulder seems to have returned to prior strength and function so will monitor for any additional tx needs.   Rehab Potential Good   PT Frequency 2x / week   PT Duration 4 weeks   PT Treatment/Interventions ADLs/Self Care Home Management;Cryotherapy;Electrical Stimulation;Moist Heat;Ultrasound;Iontophoresis  4mg /ml Dexamethasone;Therapeutic exercise;Therapeutic activities;Patient/family education;Dry needling;Taping;Vasopneumatic Device;Manual techniques;Passive range of motion   PT Next Visit Plan review neck HEP, posture/body  mechanics education   Consulted and Agree with Plan of Care Patient      Patient will benefit from skilled therapeutic intervention in order to improve the following deficits and impairments:  Postural dysfunction, Pain, Impaired UE functional use, Increased fascial restricitons, Decreased strength, Decreased range of motion  Visit Diagnosis: Abnormal posture - Plan: PT plan of care cert/re-cert  Cervicalgia - Plan: PT plan of care cert/re-cert     Problem List Patient Active Problem List   Diagnosis Date Noted  . Left shoulder pain 11/02/2016  . Chronic upper back pain 08/03/2016  . Headache, tension type, chronic 08/03/2016  . Depression, major, in partial remission (HCC) 08/03/2016      Clarita Crane, PT, DPT 11/25/16 4:51 PM    [facility] [facility] [address] [place], [place], [address] Phone: [phone]   Fax:  [phone]  Name: Caitlyn Munoz MRN: [id number] Date of Birth: Mar 05, 1991

## 2016-11-25 NOTE — Patient Instructions (Signed)
Shoulder Retraction    Tuck in chin and hold _5___ seconds.  Perform lying down or with head support sitting (ex against head rest in car) Repeat __10__ times. Do __2-3__ sessions per day.   Flexibility: Upper Trapezius Stretch    Gently grasp right side of head while holding onto edge of chair. Tilt head away until a gentle stretch is felt. Hold _30___ seconds.  Repeat to other side. Repeat __2-3__ times per set. Do __1__ sets per session. Do _2-3___ sessions per day.   Flexors, Sitting    Sit, right hand on left front side of head. Grip seat with left hand. Keeping body straight, look up and backward, bending neck back and to right. Hold __30_ seconds.  Repeat to other side. Repeat _2-3__ times per session. Do _2-3__ sessions per day.

## 2016-11-29 ENCOUNTER — Ambulatory Visit (INDEPENDENT_AMBULATORY_CARE_PROVIDER_SITE_OTHER): Payer: No Typology Code available for payment source | Admitting: Physical Therapy

## 2016-11-29 DIAGNOSIS — M542 Cervicalgia: Secondary | ICD-10-CM

## 2016-11-29 DIAGNOSIS — R293 Abnormal posture: Secondary | ICD-10-CM

## 2016-11-29 NOTE — Therapy (Signed)
[facility] [address] [place], [place], [address] Phone: [phone]   Fax:  [phone]  Physical Therapy Treatment  Patient Details  Name: Caitlyn RamusBrittany Lauren Munoz MRN: [id number] Date of Birth: 1991-04-19 Referring Provider: Dr Helane RimaErica Wallace  Encounter Date: 11/29/2016      PT End of Session - 11/29/16 0842    Visit Number 8   Number of Visits 16   Date for PT Re-Evaluation 12/23/16   Authorization Type Med Cost    Authorization - Number of Visits 30   PT Start Time 0809  pt arrived late   PT Stop Time 0842   PT Time Calculation (min) 33 min   Activity Tolerance Patient tolerated treatment well   Behavior During Therapy [facility] for tasks assessed/performed      Past Medical History:  Diagnosis Date  . Anxiety   . Depression   . Frequent headaches   . History of frequent urinary tract infections     No past surgical history on file.  There were no vitals filed for this visit.      Subjective Assessment - 11/29/16 0810    Subjective sore everywhere today; spent 7 hours working on car so sore all over.  has usual stiffness and soreness.  has a slight headache this morning, but nothing major   Pertinent History anxiety, depression   Limitations House hold activities   Patient Stated Goals improve ROM, pain; regain use of LUE, return to rock climbing   Currently in Pain? No/denies  just soreness and stiffness                         [facility] Adult PT Treatment/Exercise - 11/29/16 0811      Neck Exercises: Supine   Neck Retraction 10 reps;5 secs   Other Supine Exercise bil shoulder external rotation and horizontal abduction with cervical retraction x 10 reps each with red theraband     Manual Therapy   Manual Therapy Myofascial release   Myofascial Release bil cervical paraspinals, upper trap and levator trigger point release.  no headache and improved motion following     Neck Exercises: Stretches    Upper Trapezius Stretch 2 reps;30 seconds   Upper Trapezius Stretch Limitations bil   Other Neck Stretches SCM stretch 2x30 sec bil                     PT Long Term Goals - 11/25/16 1645      PT LONG TERM GOAL #1   Title independent with HEP (12/23/16)   Time 4   Period Weeks   Status On-going     PT LONG TERM GOAL #2   Title verbalize understanding of posture and body mechanics to decrease risk of reinjury (12/23/16)   Status On-going     PT LONG TERM GOAL #3   Title improve Lt shoulder AROM to WNL for improved function and mobility (12/13/16)   Status Achieved     PT LONG TERM GOAL #4   Title report no increase in pain at end of work day for return to regular work responsibilities (12/13/16)   Status Achieved     PT LONG TERM GOAL #5   Title report independence with ADLs without compensation for improved LUE use (12/13/16)   Status Achieved     Additional Long Term Goals   Additional Long Term Goals Yes     PT LONG TERM GOAL #6  Title improve bil cervical rotation by at least 10 degrees for improved motion and IADLs (12/23/16)   Time 4   Period Weeks   Status New     PT LONG TERM GOAL #7   Title report 50% improvement in cervical tightness and pain for improved function (12/23/16)   Time 4   Period Weeks   Status New               Plan - 11/29/16 6045    Clinical Impression Statement Pt with improved symtoms following manual therapy today noting resolve of headache and improved subjective ROM.  Feel pt will benefit from TDN if she has a headache so will montior for appropriateness.  Independent with initial HEP for neck pain today.   PT Treatment/Interventions ADLs/Self Care Home Management;Cryotherapy;Electrical Stimulation;Moist Heat;Ultrasound;Iontophoresis 4mg /ml Dexamethasone;Therapeutic exercise;Therapeutic activities;Patient/family education;Dry needling;Taping;Vasopneumatic Device;Manual techniques;Passive range of motion   PT Next Visit  Plan review neck HEP, posture/body mechanics education   Consulted and Agree with Plan of Care Patient      Patient will benefit from skilled therapeutic intervention in order to improve the following deficits and impairments:  Postural dysfunction, Pain, Impaired UE functional use, Increased fascial restricitons, Decreased strength, Decreased range of motion  Visit Diagnosis: Abnormal posture  Cervicalgia     Problem List Patient Active Problem List   Diagnosis Date Noted  . Left shoulder pain 11/02/2016  . Chronic upper back pain 08/03/2016  . Headache, tension type, chronic 08/03/2016  . Depression, major, in partial remission (HCC) 08/03/2016      Caitlyn Munoz, PT, DPT 11/29/16 8:45 AM    [facility] [address] [place], [place], [address] Phone: [phone]   Fax:  [phone]  Name: Caitlyn Munoz MRN: [id number] Date of Birth: 04-03-1991

## 2016-12-02 ENCOUNTER — Ambulatory Visit (INDEPENDENT_AMBULATORY_CARE_PROVIDER_SITE_OTHER): Payer: No Typology Code available for payment source | Admitting: Physical Therapy

## 2016-12-02 DIAGNOSIS — R293 Abnormal posture: Secondary | ICD-10-CM | POA: Diagnosis not present

## 2016-12-02 DIAGNOSIS — M542 Cervicalgia: Secondary | ICD-10-CM

## 2016-12-02 NOTE — Therapy (Signed)
[facility] [address] [place], [place], [address] Phone: [phone]   Fax:  [phone]  Physical Therapy Treatment  Patient Details  Name: Caitlyn Munoz MRN: [id number] Date of Birth: 05/12/1991 Referring Provider: Dr Helane Rima  Encounter Date: 12/02/2016      PT End of Session - 12/02/16 1623    Visit Number 9   Number of Visits 16   Date for PT Re-Evaluation 12/23/16   Authorization Type Med Cost    Authorization - Number of Visits 30   PT Start Time 1518   PT Stop Time 1556   PT Time Calculation (min) 38 min   Activity Tolerance Patient tolerated treatment well;No increased pain   Behavior During Therapy WFL for tasks assessed/performed      Past Medical History:  Diagnosis Date  . Anxiety   . Depression   . Frequent headaches   . History of frequent urinary tract infections     No past surgical history on file.  There were no vitals filed for this visit.      Subjective Assessment - 12/02/16 1524    Subjective has a dull headache today, but it's at baseline.  thinks it's mostly due to stress   Patient Stated Goals improve ROM, pain; regain use of LUE, return to rock climbing   Currently in Pain? No/denies                         [facility] Adult PT Treatment/Exercise - 12/02/16 0001      Neck Exercises: Standing   Upper Extremity D1 Flexion;Extension;20 reps;Theraband   Theraband Level (UE D1) Level 2 (Red)   Other Standing Exercises bil er 2x10; bil horizontal abduction 2x10 with red theraband   Other Standing Exercises scapular retraction 2x10 with red theraband     Neck Exercises: Supine   Other Supine Exercise bil shoulder external rotation and horizontal abduction with cervical retraction 2 x 10 reps each with red theraband     Manual Therapy   Manual Therapy Myofascial release   Myofascial Release bil cervical paraspinals, upper trap and levator trigger point release.   no headache and improved motion following                     PT Long Term Goals - 11/25/16 1645      PT LONG TERM GOAL #1   Title independent with HEP (12/23/16)   Time 4   Period Weeks   Status On-going     PT LONG TERM GOAL #2   Title verbalize understanding of posture and body mechanics to decrease risk of reinjury (12/23/16)   Status On-going     PT LONG TERM GOAL #3   Title improve Lt shoulder AROM to WNL for improved function and mobility (12/13/16)   Status Achieved     PT LONG TERM GOAL #4   Title report no increase in pain at end of work day for return to regular work responsibilities (12/13/16)   Status Achieved     PT LONG TERM GOAL #5   Title report independence with ADLs without compensation for improved LUE use (12/13/16)   Status Achieved     Additional Long Term Goals   Additional Long Term Goals Yes     PT LONG TERM GOAL #6   Title improve bil cervical rotation by at least 10 degrees for improved motion and IADLs (12/23/16)   Time 4  Period Weeks   Status New     PT LONG TERM GOAL #7   Title report 50% improvement in cervical tightness and pain for improved function (12/23/16)   Time 4   Period Weeks   Status New               Plan - 12/02/16 1624    Clinical Impression Statement Pt tolerated exercises well and reports minimal symptoms with headaches today.  Decreased trigger points noted today with manual.  May be able to decr frequency to 1x/wk if continues to progress well.   PT Treatment/Interventions ADLs/Self Care Home Management;Cryotherapy;Electrical Stimulation;Moist Heat;Ultrasound;Iontophoresis 4mg /ml Dexamethasone;Therapeutic exercise;Therapeutic activities;Patient/family education;Dry needling;Taping;Vasopneumatic Device;Manual techniques;Passive range of motion   PT Next Visit Plan posture/body mechanics education, continue posture exercises and manual      Patient will benefit from skilled therapeutic intervention in  order to improve the following deficits and impairments:  Postural dysfunction, Pain, Impaired UE functional use, Increased fascial restricitons, Decreased strength, Decreased range of motion  Visit Diagnosis: Abnormal posture  Cervicalgia     Problem List Patient Active Problem List   Diagnosis Date Noted  . Left shoulder pain 11/02/2016  . Chronic upper back pain 08/03/2016  . Headache, tension type, chronic 08/03/2016  . Depression, major, in partial remission (HCC) 08/03/2016      Clarita CraneStephanie F Evany Schecter, PT, DPT 12/02/16 4:26 PM    [facility] [address] [place], [place], [address] Phone: [phone]   Fax:  [phone]  Name: Caitlyn Munoz MRN: [id number] Date of Birth: Dec 31, 1990

## 2016-12-06 ENCOUNTER — Ambulatory Visit (INDEPENDENT_AMBULATORY_CARE_PROVIDER_SITE_OTHER): Payer: No Typology Code available for payment source | Admitting: Physical Therapy

## 2016-12-06 DIAGNOSIS — M542 Cervicalgia: Secondary | ICD-10-CM

## 2016-12-06 DIAGNOSIS — R293 Abnormal posture: Secondary | ICD-10-CM | POA: Diagnosis not present

## 2016-12-06 NOTE — Therapy (Addendum)
[facility] [address] [place], [place], [address] Phone: [phone]   Fax:  [phone]  Physical Therapy Treatment  Patient Details  Name: Caitlyn Munoz MRN: [id number] Date of Birth: [birth year] Referring Provider: Dr Briscoe Deutscher  Encounter Date: 12/06/2016      PT End of Session - 12/06/16 1652    Visit Number 10   PT Start Time 3545  pt arrived late   PT Stop Time 1600   PT Time Calculation (min) 35 min   Activity Tolerance Patient tolerated treatment well;No increased pain   Behavior During Therapy WFL for tasks assessed/performed      Past Medical History:  Diagnosis Date  . Anxiety   . Depression   . Frequent headaches   . History of frequent urinary tract infections     No past surgical history on file.  There were no vitals filed for this visit.      Subjective Assessment - 12/06/16 1525    Subjective doing well today; took today off work.  had a bad headache friday, but otherwise no pain.  states headaches are happening less frequently; feels ~ 68% improved.   Patient Stated Goals improve ROM, pain; regain use of LUE, return to rock climbing   Currently in Pain? No/denies            [facility] PT Assessment - 12/06/16 1541      AROM   Cervical Flexion 46   Cervical Extension 50   Cervical - Right Side Bend 30   Cervical - Left Side Bend 30   Cervical - Right Rotation 59   Cervical - Left Rotation 63                     [facility] Adult PT Treatment/Exercise - 12/06/16 1526      Self-Care   Self-Care Posture   Posture ways to improve posture and work station set up     Neck Exercises: Theraband   Shoulder External Rotation 15 reps;Green   Shoulder External Rotation Limitations standing   Horizontal ABduction 15 reps;Green   Horizontal ABduction Limitations standing     Neck Exercises: Standing   Upper Extremity Flexion with Stabilization Flexion;10 reps   UE Flexion  with Stabilization Limitations red theraband   Upper Extremity D1 Flexion;Extension;15 reps;Theraband   Theraband Level (UE D1) Level 3 (Green)   Other Standing Exercises "W" arms with green theraband x 15 reps                PT Education - 12/06/16 1652    Education provided Yes   Education Details HEP, posture/body International aid/development worker) Educated Patient   Methods Explanation;Handout;Demonstration   Comprehension Verbalized understanding;Returned demonstration             PT Long Term Goals - 12/06/16 1653      PT LONG TERM GOAL #1   Title independent with HEP (12/23/16)   Status Achieved     PT LONG TERM GOAL #2   Title verbalize understanding of posture and body mechanics to decrease risk of reinjury (12/23/16)   Status Achieved     PT LONG TERM GOAL #3   Title improve Lt shoulder AROM to WNL for improved function and mobility (12/13/16)   Status Achieved     PT LONG TERM GOAL #4   Title report no increase in pain at end of work day for return to regular work responsibilities (12/13/16)   Status  Achieved     PT LONG TERM GOAL #5   Title report independence with ADLs without compensation for improved LUE use (12/13/16)   Status Achieved     PT LONG TERM GOAL #6   Title improve bil cervical rotation by at least 10 degrees for improved motion and IADLs (12/23/16)   Baseline 12/06/16: met to right; left unchanged but WNL   Status Partially Met     PT LONG TERM GOAL #7   Title report 50% improvement in cervical tightness and pain for improved function (12/23/16)   Baseline 12/06/16: reports 68% improved   Status Achieved               Plan - 12/06/16 1654    Clinical Impression Statement Pt reports decrease in frequency of headaches to < 1 per week and previously was having headaches ~ 2x/wk.  Feels headaches are now stress related and feels she is able to manage.  ROM improved and pt reports being back to baseline.  Reviewed posture and body mechanics with  pt to help decrease risk of reinjury and properly set up work desk.  To hold PT x 30 days, pt to return if headaches increase.   PT Treatment/Interventions ADLs/Self Care Home Management;Cryotherapy;Electrical Stimulation;Moist Heat;Ultrasound;Iontophoresis 38m/ml Dexamethasone;Therapeutic exercise;Therapeutic activities;Patient/family education;Dry needling;Taping;Vasopneumatic Device;Manual techniques;Passive range of motion   PT Next Visit Plan hold x 30 days; if no return d/c, otherwise reassess   Consulted and Agree with Plan of Care Patient      Patient will benefit from skilled therapeutic intervention in order to improve the following deficits and impairments:  Postural dysfunction, Pain, Impaired UE functional use, Increased fascial restricitons, Decreased strength, Decreased range of motion  Visit Diagnosis: Abnormal posture  Cervicalgia     Problem List Patient Active Problem List   Diagnosis Date Noted  . Left shoulder pain 11/02/2016  . Chronic upper back pain 08/03/2016  . Headache, tension type, chronic 08/03/2016  . Depression, major, in partial remission (HOtis 08/03/2016      SLaureen Abrahams PT, DPT 12/06/16 4:57 PM     [place] [address]Phone: [phone]  Fax:  [phone] Name: BAvaya McjunkinsMRN: [id number] of Birth: [birth year]     PHYSICAL THERAPY DISCHARGE SUMMARY  Visits from Start of Care: 10  Current functional level related to goals / functional outcomes: See above   Remaining deficits: Held PT due to improvement in symptoms; pt did not return as she would have if symptoms worsened.  Still having occasional headaches, but less intensity and frequency.   Education / Equipment: HEP, posture  Plan: Patient agrees to discharge.  Patient goals were met. Patient is being discharged due to meeting the stated rehab goals.  ?????      SLaureen Abrahams PT, DPT 01/05/17 8:00 AM  [place] [address]Phone: [phone] Fax: [phone]

## 2016-12-06 NOTE — Patient Instructions (Addendum)
Resisted Horizontal Abduction: Bilateral    Sit or stand, tubing in both hands, arms out in front. Keeping arms straight, pinch shoulder blades together and stretch arms out. Repeat _15-20___ times per set. Do __1__ sets per session. Do __2-3__ sessions per day.  http://orth.exer.us/969   Copyright  VHI. All rights reserved.    Resisted External Rotation: in Neutral - Bilateral    Sit or stand, tubing in both hands, elbows at sides, bent to 90, forearms forward. Pinch shoulder blades together and rotate forearms out. Keep elbows at sides. Repeat _15-20___ times per set. Do __1__ sets per session. Do __2-3__ sessions per day.    Perform diagonals and "w" arms x 15 reps each with green theraband.   Sleeping on Back  Place pillow under knees. A pillow with cervical support and a roll around waist are also helpful. Copyright  VHI. All rights reserved.  Sleeping on Side Place pillow between knees. Use cervical support under neck and a roll around waist as needed. Copyright  VHI. All rights reserved.   Sleeping on Stomach   If this is the only desirable sleeping position, place pillow under lower legs, and under stomach or chest as needed.  Posture - Sitting   Sit upright, head facing forward. Try using a roll to support lower back. Keep shoulders relaxed, and avoid rounded back. Keep hips level with knees. Avoid crossing legs for long periods. Stand to Sit / Sit to Stand   To sit: Bend knees to lower self onto front edge of chair, then scoot back on seat. To stand: Reverse sequence by placing one foot forward, and scoot to front of seat. Use rocking motion to stand up.   Work Height and Reach  Ideal work height is no more than 2 to 4 inches below elbow level when standing, and at elbow level when sitting. Reaching should be limited to arm's length, with elbows slightly bent.  Bending  Bend at hips and knees, not back. Keep feet shoulder-width apart.    Posture -  Standing   Good posture is important. Avoid slouching and forward head thrust. Maintain curve in low back and align ears over shoul- ders, hips over ankles.  Alternating Positions   Alternate tasks and change positions frequently to reduce fatigue and muscle tension. Take rest breaks. Computer Work   Position work to Art gallery managerface forward. Use proper work and seat height. Keep shoulders back and down, wrists straight, and elbows at right angles. Use chair that provides full back support. Add footrest and lumbar roll as needed.  Getting Into / Out of Car  Lower self onto seat, scoot back, then bring in one leg at a time. Reverse sequence to get out.  Dressing  Lie on back to pull socks or slacks over feet, or sit and bend leg while keeping back straight.    Housework - Sink  Place one foot on ledge of cabinet under sink when standing at sink for prolonged periods.   Pushing / Pulling  Pushing is preferable to pulling. Keep back in proper alignment, and use leg muscles to do the work.  Deep Squat   Squat and lift with both arms held against upper trunk. Tighten stomach muscles without holding breath. Use smooth movements to avoid jerking.  Avoid Twisting   Avoid twisting or bending back. Pivot around using foot movements, and bend at knees if needed when reaching for articles.  Carrying Luggage   Distribute weight evenly on both sides. Use a cart  whenever possible. Do not twist trunk. Move body as a unit.   Lifting Principles .Maintain proper posture and head alignment. .Slide object as close as possible before lifting. .Move obstacles out of the way. .Test before lifting; ask for help if too heavy. .Tighten stomach muscles without holding breath. .Use smooth movements; do not jerk. .Use legs to do the work, and pivot with feet. .Distribute the work load symmetrically and close to the center of trunk. .Push instead of pull whenever possible.   Ask For Help   Ask for  help and delegate to others when possible. Coordinate your movements when lifting together, and maintain the low back curve.  Log Roll   Lying on back, bend left knee and place left arm across chest. Roll all in one movement to the right. Reverse to roll to the left. Always move as one unit. Housework - Sweeping  Use long-handled equipment to avoid stooping.   Housework - Wiping  Position yourself as close as possible to reach work surface. Avoid straining your back.  Laundry - Unloading Wash   To unload small items at bottom of washer, lift leg opposite to arm being used to reach.  Gardening - Raking  Move close to area to be raked. Use arm movements to do the work. Keep back straight and avoid twisting.     Cart  When reaching into cart with one arm, lift opposite leg to keep back straight.   Getting Into / Out of Bed  Lower self to lie down on one side by raising legs and lowering head at the same time. Use arms to assist moving without twisting. Bend both knees to roll onto back if desired. To sit up, start from lying on side, and use same move-ments in reverse. Housework - Vacuuming  Hold the vacuum with arm held at side. Step back and forth to move it, keeping head up. Avoid twisting.   Laundry - Armed forces training and education officer so that bending and twisting can be avoided.   Laundry - Unloading Dryer  Squat down to reach into clothes dryer or use a reacher.  Gardening - Weeding / Psychiatric nurse or Kneel. Knee pads may be helpful.

## 2016-12-10 ENCOUNTER — Encounter: Payer: Self-pay | Admitting: Sports Medicine

## 2016-12-10 ENCOUNTER — Ambulatory Visit (INDEPENDENT_AMBULATORY_CARE_PROVIDER_SITE_OTHER): Payer: No Typology Code available for payment source | Admitting: Sports Medicine

## 2016-12-10 VITALS — BP 100/70 | HR 80 | Ht 68.0 in | Wt 155.0 lb

## 2016-12-10 DIAGNOSIS — M7502 Adhesive capsulitis of left shoulder: Secondary | ICD-10-CM | POA: Diagnosis not present

## 2016-12-10 DIAGNOSIS — M25512 Pain in left shoulder: Secondary | ICD-10-CM | POA: Diagnosis not present

## 2016-12-10 NOTE — Progress Notes (Signed)
OFFICE VISIT NOTE Caitlyn FellsMichael D. Delorise Shinerigby, Munoz  [facility]ait[facility]oz [facility] [phone]  Caitlyn RamusBrittany Lauren Munoz - 26 y.o. female MRN [id number]  Date of birth: [birth year]  Visit Date: 12/10/2016  PCP: Caitlyn Munoz   Referred by: SwazilandJordan, Betty G, MD  Clovis CaoAutumn Munoz, cma acting as scribe for Dr. Berline Choughigby.  SUBJECTIVE:   Chief Complaint  Patient presents with  . Follow-up  . Adhesive Capsulitis of Left Shoulder   HPI: As below and per problem based documentation when appropriate.   Caitlyn Munoz reports as a follow up for her left shoulder pain. Steroid Injection administered on 10-28-2016 and Multiple PT sessions have helped tremendously. No medication therapy at this specific time. She said that her pain is 90 percent better. At home exercises are done approx 2 times per week. No new concerns.      Review of Systems  Constitutional: Negative for chills, diaphoresis, fever, malaise/fatigue and weight loss.  HENT: Negative.   Eyes: Negative.   Respiratory: Negative.   Cardiovascular: Negative.   Gastrointestinal: Negative.   Genitourinary: Negative.   Musculoskeletal: Negative for back pain, falls, joint pain, myalgias and neck pain.  Skin: Negative for itching and rash.  Neurological: Negative.  Negative for weakness.  Endo/Heme/Allergies: Negative for environmental allergies and polydipsia. Does not bruise/bleed easily.  Psychiatric/Behavioral: Negative.     Otherwise per HPI.  HISTORY & PERTINENT PRIOR DATA:  No specialty comments available. She reports that she has never smoked. She has never used smokeless tobacco. No results for input(s): HGBA1C, LABURIC in the last 8760 hours. Medications & Allergies reviewed per EMR Patient Active Problem List   Diagnosis Date Noted  . Left shoulder pain 11/02/2016  . Chronic upper back pain 08/03/2016  . Headache, tension type, chronic 08/03/2016  . Depression, major, in partial remission (HCC)  08/03/2016   Past Medical History:  Diagnosis Date  . Anxiety   . Depression   . Frequent headaches   . History of frequent urinary tract infections    Family History  Problem Relation Age of Onset  . Heart disease Maternal Grandfather    History reviewed. No pertinent surgical history. Social History   Occupational History  . Not on file.   Social History Main Topics  . Smoking status: Never Smoker  . Smokeless tobacco: Never Used  . Alcohol use No  . Drug use: No  . Sexual activity: Not on file    OBJECTIVE:  VS:  HT:5\' 8"  (172.7 cm)   WT:155 lb (70.3 kg)  BMI:23.6    BP:100/70  HR:80bpm  TEMP: ( )  RESP:99 % EXAM: Findings:  Shoulders overall well aligned.  She has marked improvement in her overhead range of motion is still limited in terminal internal rotation and abduction by 10-15 on the left compared to the right.  Rotator cuff strength is intact but slightly diminished on the left compared to right.  At 30 of abduction she is still slightly limited with external rotation.  No pain with Hawkins or Neer's     No results found. ASSESSMENT & PLAN:   Problem List Items Addressed This Visit    Left shoulder pain    Overall she is doing quite well with intra-articular injection and physical therapy.  Her range of motion has significantly improved but not entirely normalized.  She should continue with home therapeutic exercises and will follow up in 6 weeks to ensure complete resolution.       Other  Visit Diagnoses    Adhesive capsulitis of left shoulder    -  Primary      Follow-up: Return in about 6 weeks (around 01/21/2017).    CMA/ATC served as Neurosurgeon during this visit. History, Physical, and Plan performed by medical provider. Documentation and orders reviewed and attested to.      Gaspar Bidding, Munoz    Corinda [facility] Physician

## 2016-12-26 NOTE — Assessment & Plan Note (Signed)
Overall she is doing quite well with intra-articular injection and physical therapy.  Her range of motion has significantly improved but not entirely normalized.  She should continue with home therapeutic exercises and will follow up in 6 weeks to ensure complete resolution.

## 2017-06-29 ENCOUNTER — Ambulatory Visit (INDEPENDENT_AMBULATORY_CARE_PROVIDER_SITE_OTHER): Payer: No Typology Code available for payment source | Admitting: Family Medicine

## 2017-06-29 ENCOUNTER — Encounter: Payer: Self-pay | Admitting: Family Medicine

## 2017-06-29 ENCOUNTER — Ambulatory Visit: Payer: Self-pay | Admitting: *Deleted

## 2017-06-29 VITALS — BP 114/68 | HR 75 | Temp 98.1°F | Ht 68.0 in | Wt 154.4 lb

## 2017-06-29 DIAGNOSIS — H9201 Otalgia, right ear: Secondary | ICD-10-CM

## 2017-06-29 DIAGNOSIS — J029 Acute pharyngitis, unspecified: Secondary | ICD-10-CM | POA: Diagnosis not present

## 2017-06-29 LAB — POCT RAPID STREP A (OFFICE): Rapid Strep A Screen: NEGATIVE

## 2017-06-29 MED ORDER — METHYLPREDNISOLONE ACETATE 80 MG/ML IJ SUSP
80.0000 mg | Freq: Once | INTRAMUSCULAR | Status: AC
  Administered 2017-06-29: 80 mg via INTRAMUSCULAR

## 2017-06-29 MED ORDER — IPRATROPIUM BROMIDE 0.06 % NA SOLN: 2.0000 | Freq: Four times a day (QID) | NASAL | 0 refills | Status: DC

## 2017-06-29 NOTE — Progress Notes (Signed)
    Subjective:  Caitlyn Munoz is a 27 y.o. female who presents today with a chief complaint of right ear pain.   HPI:  Ear Pain, acute issue Symptoms started about 2 weeks ago. Worsened over the past 3 days.  Associated with sore throat and a "roaring" sensation in her right ear.  No cough, sneeze, or rhinorrhea.  No fevers or chills.  No sick contacts.  No difficulty swallowing or speaking.  She tried taking some ibuprofen without significant relief in her symptoms.  No other obvious precipitating events.  No other obvious alleviating or aggravating factors.   ROS: Per HPI  PMH: she reports that  has never smoked. she has never used smokeless tobacco. She reports that she does not drink alcohol or use drugs.   Objective:  Physical Exam: BP 114/68 (BP Location: Left Arm, Patient Position: Sitting, Cuff Size: Normal)   Pulse 75   Temp 98.1 F (36.7 C) (Oral)   Ht 5\' 8"  (1.727 m)   Wt 154 lb 6.4 oz (70 kg)   SpO2 98%   BMI 23.48 kg/m   Gen: NAD, resting comfortably HEENT: Right TM with effusion.  Left TM clear.  Mild erythema to right side of oropharynx without exudate.  Mild tender anterior cervical lymphadenopathy on right. CV: RRR with no murmurs appreciated Pulm: NWOB, CTAB with no crackles, wheezes, or rhonchi  Rapid strep: Negative  Assessment/Plan:  Sore throat Otalgia Rapid strep negative.  Symptoms most consistent with upper respiratory infection.  We will give 80 mg IM Depo-Medrol today for her sore throat.  We will also start Atrovent nasal spray to help alleviate some congestion and hopefully open her eustachian tube to allow for drainage of her middle ear.  Encouraged good oral hydration.  Recommended Tylenol and/or Motrin as needed for pain and low-grade fever.  Return precautions reviewed.  Follow-up as needed.  Katina Degreealeb M. Jimmey RalphParker, MD 06/29/2017 11:37 AM

## 2017-06-29 NOTE — Telephone Encounter (Signed)
Pt reports had "bad cold" 4 weeks ago. Two weeks ago right ear "fullness with roaring sound." Worsened past 3 days. Now with sore, reddened throat "with patches."   Reason for Disposition . Earache  (Exceptions: brief ear pain of < 60 minutes duration, earache occurring during air travel  Answer Assessment - Initial Assessment Questions 1. LOCATION: "Which ear is involved?"     right 2. ONSET: "When did the ear start hurting"      About 2 weeks ago after a cold 3. SEVERITY: "How bad is the pain?"  (Scale 1-10; mild, moderate or severe)   - MILD (1-3): doesn't interfere with normal activities    - MODERATE (4-7): interferes with normal activities or awakens from sleep    - SEVERE (8-10): excruciating pain, unable to do any normal activities      Achy, full feeling 4. URI SYMPTOMS: " Do you have a runny nose or cough?"     no 5. FEVER: "Do you have a fever?" If so, ask: "What is your temperature, how was it measured, and when did it start?"     no 6. CAUSE: "Have you been swimming recently?", "How often do you use Q-TIPS?", "Have you had any recent air travel or scuba diving?"     no 7. OTHER SYMPTOMS: "Do you have any other symptoms?" (e.g., headache, stiff neck, dizziness, vomiting, runny nose, decreased hearing)    Sore throat, red on right side only 5/10.  Protocols used: EARACHE-A-AH

## 2017-06-29 NOTE — Addendum Note (Signed)
Addended by: Koleen DistanceAGNER, AMBER M on: 06/29/2017 12:08 PM   Modules accepted: Orders

## 2017-06-29 NOTE — Patient Instructions (Signed)
Your strep test is negative.  You do not have any other signs of bacterial infection.  Start Atrovent nasal spray.  You can also take Tylenol and/or Motrin as needed for your ear pain.  Your symptoms should gradually improve over the next few days.  If your symptoms do not improve in the next 1-2 weeks or if your symptoms worsen, please let us know.  Take care, Dr. Jimmey RalphParker

## 2017-07-12 ENCOUNTER — Encounter: Payer: Self-pay | Admitting: Family Medicine

## 2017-07-12 ENCOUNTER — Ambulatory Visit (INDEPENDENT_AMBULATORY_CARE_PROVIDER_SITE_OTHER): Payer: No Typology Code available for payment source | Admitting: Family Medicine

## 2017-07-12 VITALS — BP 130/62 | HR 99 | Temp 98.6°F | Wt 152.6 lb

## 2017-07-12 DIAGNOSIS — R05 Cough: Secondary | ICD-10-CM | POA: Diagnosis not present

## 2017-07-12 DIAGNOSIS — R059 Cough, unspecified: Secondary | ICD-10-CM

## 2017-07-12 LAB — POC INFLUENZA A&B (BINAX/QUICKVUE)
Influenza A, POC: NEGATIVE
Influenza B, POC: NEGATIVE

## 2017-07-12 LAB — POCT MONO (EPSTEIN BARR VIRUS): Mono, POC: NEGATIVE

## 2017-07-12 MED ORDER — DOXYCYCLINE HYCLATE 100 MG PO TABS: 100.0000 mg | ORAL_TABLET | Freq: Two times a day (BID) | ORAL | 0 refills | Status: DC

## 2017-07-12 MED ORDER — PREDNISONE 5 MG PO TABS: ORAL_TABLET | ORAL | 0 refills | Status: DC

## 2017-07-12 MED ORDER — GUAIFENESIN-CODEINE 100-10 MG/5ML PO SYRP: 10.0000 mL | ORAL_SOLUTION | Freq: Every evening | ORAL | 0 refills | Status: DC | PRN

## 2017-07-12 NOTE — Progress Notes (Signed)
Caitlyn Munoz is a 27 y.o. female here for an acute visit.  History of Present Illness:   Influenza  This is a new problem. The current episode started yesterday. The problem has been gradually worsening. Associated symptoms include chills, congestion, coughing, fatigue, a fever, myalgias, nausea, a sore throat and swollen glands. Nothing aggravates the symptoms. She has tried acetaminophen for the symptoms. The treatment provided mild relief.   PMHx, SurgHx, SocialHx, Medications, and Allergies were reviewed in the Visit Navigator and updated as appropriate.  Current Medications:   .  baclofen (LIORESAL) 10 MG tablet, Take 1 tablet (10 mg total) by mouth at bedtime as needed for muscle spasms., Disp: 90 each, Rfl: 0 .  valACYclovir (VALTREX) 1000 MG tablet, Take 1,000 mg by mouth 2 (two) times daily as needed., Disp: , Rfl:   No Known Allergies   Review of Systems:   Pertinent items are noted in the HPI. Otherwise, ROS is negative.  Vitals:   Vitals:   07/12/17 1631  BP: 130/62  Pulse: 99  Temp: 98.6 F (37 C)  TempSrc: Oral  SpO2: 98%  Weight: 152 lb 9.6 oz (69.2 kg)     Body mass index is 23.2 kg/m.  Physical Exam:   Physical Exam  Constitutional: She is oriented to person, place, and time. She appears well-developed and well-nourished. She has a sickly appearance. No distress.  HENT:  Head: Normocephalic and atraumatic.  Right Ear: External ear normal.  Left Ear: External ear normal.  Nose: Mucosal edema present.  Mouth/Throat: Oropharynx is clear and moist.  Eyes: Conjunctivae and EOM are normal. Pupils are equal, round, and reactive to light.  Neck: Normal range of motion. Neck supple. No thyromegaly present.  Cardiovascular: Normal rate, regular rhythm, normal heart sounds and intact distal pulses.  Pulmonary/Chest: Effort normal and breath sounds normal. No respiratory distress.  Abdominal: Soft. Bowel sounds are normal.  Musculoskeletal:  Normal range of motion.  Lymphadenopathy:    She has no cervical adenopathy.  Neurological: She is alert and oriented to person, place, and time.  Skin: Skin is warm and dry. Capillary refill takes less than 2 seconds.  Psychiatric: She has a normal mood and affect. Her behavior is normal.  Nursing note and vitals reviewed.   Results for orders placed or performed in visit on 07/12/17  CBC with Differential/Platelet  Result Value Ref Range   WBC 8.3 4.0 - 10.5 K/uL   RBC 4.56 3.87 - 5.11 Mil/uL   Hemoglobin 13.8 12.0 - 15.0 g/dL   HCT 13.042.0 86.536.0 - 78.446.0 %   MCV 92.0 78.0 - 100.0 fl   MCHC 33.0 30.0 - 36.0 g/dL   RDW 69.613.4 29.511.5 - 28.415.5 %   Platelets 250.0 150.0 - 400.0 K/uL   Neutrophils Relative % 72.6 43.0 - 77.0 %   Lymphocytes Relative 16.2 12.0 - 46.0 %   Monocytes Relative 10.4 3.0 - 12.0 %   Eosinophils Relative 0.4 0.0 - 5.0 %   Basophils Relative 0.4 0.0 - 3.0 %   Neutro Abs 6.0 1.4 - 7.7 K/uL   Lymphs Abs 1.3 0.7 - 4.0 K/uL   Monocytes Absolute 0.9 0.1 - 1.0 K/uL   Eosinophils Absolute 0.0 0.0 - 0.7 K/uL   Basophils Absolute 0.0 0.0 - 0.1 K/uL  Comprehensive metabolic panel  Result Value Ref Range   Sodium 139 135 - 145 mEq/L   Potassium 4.7 3.5 - 5.1 mEq/L   Chloride 102 96 - 112 mEq/L  CO2 31 19 - 32 mEq/L   Glucose, Bld 97 70 - 99 mg/dL   BUN 8 6 - 23 mg/dL   Creatinine, Ser 1.61 0.40 - 1.20 mg/dL   Total Bilirubin 0.4 0.2 - 1.2 mg/dL   Alkaline Phosphatase 64 39 - 117 U/L   AST 15 0 - 37 U/L   ALT 9 0 - 35 U/L   Total Protein 7.7 6.0 - 8.3 g/dL   Albumin 4.6 3.5 - 5.2 g/dL   Calcium 9.8 8.4 - 09.6 mg/dL   GFR 04.54 >09.81 mL/min  POC Influenza A&B(BINAX/QUICKVUE)  Result Value Ref Range   Influenza A, POC Negative Negative   Influenza B, POC Negative Negative  POCT Mono (Epstein Barr Virus)  Result Value Ref Range   Mono, POC Negative Negative   Assessment and Plan:   1. Cough Likely viral. ILI. No red flags. History of similar picture < 2 weeks  ago. Labs reassuring. Symptomatic care reviewed. Safety net Doxy Rx provided.  - POC Influenza A&B(BINAX/QUICKVUE) - guaiFENesin-codeine (CHERATUSSIN AC) 100-10 MG/5ML syrup; Take 10 mLs by mouth at bedtime as needed for cough or congestion.  Dispense: 120 mL; Refill: 0 - doxycycline (VIBRA-TABS) 100 MG tablet; Take 1 tablet (100 mg total) by mouth 2 (two) times daily.  Dispense: 20 tablet; Refill: 0 - predniSONE (DELTASONE) 5 MG tablet; 6-5-4-3-2-1-off  Dispense: 21 tablet; Refill: 0 - CBC with Differential/Platelet - Comprehensive metabolic panel - POCT Mono (Epstein Barr Virus)   . Reviewed expectations re: course of current medical issues. . Discussed self-management of symptoms. . Outlined signs and symptoms indicating need for more acute intervention. . Patient verbalized understanding and all questions were answered. Marland Kitchen Health Maintenance issues including appropriate healthy diet, exercise, and smoking avoidance were discussed with patient. . See orders for this visit as documented in the electronic medical record. . Patient received an After Visit Summary.   Helane Rima, DO [place] 07/17/2017

## 2017-07-13 LAB — CBC WITH DIFFERENTIAL/PLATELET
Basophils Absolute: 0 10*3/uL (ref 0.0–0.1)
Basophils Relative: 0.4 % (ref 0.0–3.0)
Eosinophils Absolute: 0 10*3/uL (ref 0.0–0.7)
Eosinophils Relative: 0.4 % (ref 0.0–5.0)
HCT: 42 % (ref 36.0–46.0)
Hemoglobin: 13.8 g/dL (ref 12.0–15.0)
Lymphocytes Relative: 16.2 % (ref 12.0–46.0)
Lymphs Abs: 1.3 10*3/uL (ref 0.7–4.0)
MCHC: 33 g/dL (ref 30.0–36.0)
MCV: 92 fl (ref 78.0–100.0)
Monocytes Absolute: 0.9 10*3/uL (ref 0.1–1.0)
Monocytes Relative: 10.4 % (ref 3.0–12.0)
Neutro Abs: 6 10*3/uL (ref 1.4–7.7)
Neutrophils Relative %: 72.6 % (ref 43.0–77.0)
Platelets: 250 10*3/uL (ref 150.0–400.0)
RBC: 4.56 Mil/uL (ref 3.87–5.11)
RDW: 13.4 % (ref 11.5–15.5)
WBC: 8.3 10*3/uL (ref 4.0–10.5)

## 2017-07-13 LAB — COMPREHENSIVE METABOLIC PANEL
ALT: 9 U/L (ref 0–35)
AST: 15 U/L (ref 0–37)
Albumin: 4.6 g/dL (ref 3.5–5.2)
Alkaline Phosphatase: 64 U/L (ref 39–117)
BUN: 8 mg/dL (ref 6–23)
CO2: 31 mEq/L (ref 19–32)
Calcium: 9.8 mg/dL (ref 8.4–10.5)
Chloride: 102 mEq/L (ref 96–112)
Creatinine, Ser: 0.8 mg/dL (ref 0.40–1.20)
GFR: 90.2 mL/min (ref >60.00)
Glucose, Bld: 97 mg/dL (ref 70–99)
Potassium: 4.7 mEq/L (ref 3.5–5.1)
Sodium: 139 mEq/L (ref 135–145)
Total Bilirubin: 0.4 mg/dL (ref 0.2–1.2)
Total Protein: 7.7 g/dL (ref 6.0–8.3)

## 2017-07-20 ENCOUNTER — Ambulatory Visit: Payer: No Typology Code available for payment source | Admitting: Family Medicine

## 2017-07-29 ENCOUNTER — Ambulatory Visit: Payer: Self-pay

## 2017-07-29 ENCOUNTER — Encounter: Payer: Self-pay | Admitting: Family Medicine

## 2017-07-29 ENCOUNTER — Ambulatory Visit (INDEPENDENT_AMBULATORY_CARE_PROVIDER_SITE_OTHER): Payer: No Typology Code available for payment source | Admitting: Family Medicine

## 2017-07-29 VITALS — BP 122/74 | HR 68 | Temp 98.2°F | Ht 68.0 in | Wt 152.0 lb

## 2017-07-29 DIAGNOSIS — J069 Acute upper respiratory infection, unspecified: Secondary | ICD-10-CM

## 2017-07-29 NOTE — Telephone Encounter (Signed)
Pt called because of on going cough and congestion; she was seen by Dr Helane RimaErica Wallace on 07/12/17 and was given medication, and she states that she felt better; on 07/24/17 she had post nasal drainage and is now experiencing head congestion and small amounts of yellow thick mucus; pt states that now she has rough cough, congestion in sinus and head, and bilateral ear pressure; nurse triage initiated and recommendation made for pt to see physician within 24 hours; pt offered and accepted [facility]/29/17 at 1520; pt verbalizes understanding.  Reason for Disposition . [1] Continuous (nonstop) coughing interferes with work or school AND [2] no improvement using cough treatment per Care Advice  Answer Assessment - Initial Assessment Questions 1. ONSET: "When did the cough begin?"      07/27/17 2. SEVERITY: "How bad is the cough today?"      Painful moderate to severe 3. RESPIRATORY DISTRESS: "Describe your breathing."    Shortness of breath as she is talking, not sure if this is due to congestion 4. FEVER: "Do you have a fever?" If so, ask: "What is your temperature, how was it measured, and when did it start?"     no 5. SPUTUM: "Describe the color of your sputum" (clear, white, yellow, green)     yellow 6. HEMOPTYSIS: "Are you coughing up any blood?" If so ask: "How much?" (flecks, streaks, tablespoons, etc.)     no 7. CARDIAC HISTORY: "Do you have any history of heart disease?" (e.g., heart attack, congestive heart failure)      no 8. LUNG HISTORY: "Do you have any history of lung disease?"  (e.g., pulmonary embolus, asthma, emphysema)   Asthma as a child 9. PE RISK FACTORS: "Do you have a history of blood clots?" (or: recent major surgery, recent prolonged travel, bedridden )      10. OTHER SYMPTOMS: "Do you have any other symptoms?" (e.g., runny nose, wheezing, chest pain)       no 11. PREGNANCY: "Is there any chance you are pregnant?" "When was your  last menstrual period?"       No LMP 07/23/17 12. TRAVEL: "Have you traveled out of the country in the last month?" (e.g., travel history, exposures) no  Protocols used: COUGH - ACUTE PRODUCTIVE-A-AH

## 2017-07-29 NOTE — Assessment & Plan Note (Addendum)
Her symptoms seem viral nature. Nothing on exam demonstrates a bacterial infection just finished doxycycline last Friday. Unclear as to why she has had repeated upper respiratory infections this past month. Testing has been negative with negative rapid strep, Monospot, and flu thus far. Last CBC did not demonstrate an infection on the white count. - counseled on supportive care  - given indications to follow up

## 2017-07-29 NOTE — Progress Notes (Signed)
Caitlyn Munoz - 27 y.o. female MRN [id number]  Date of birth: 10/06/1990  SUBJECTIVE:  Including CC & ROS.  Chief Complaint  Patient presents with  . Sinus Problem    Caitlyn RamusBrittany Lauren Mccosh is a 27 y.o. female that is presenting with sore throat, cough drainage. This has been ongoing for four days. Denies body aches or chills. She just completed doxycyline last week due to a cough. She has been taking tylenol with no improvement. She feels she was back to normal over last weekend and then started having these symptoms on Monday. Has traveled to [place] last weekend. Doesn't feel like she has been around anyone with similar symptoms.    Review of Systems  Constitutional: Negative for fever.  HENT: Positive for rhinorrhea and sinus pressure.   Respiratory: Negative for shortness of breath.   Cardiovascular: Negative for chest pain.  Gastrointestinal: Negative for abdominal pain.    HISTORY: Past Medical, Surgical, Social, and Family History Reviewed & Updated per EMR.   Pertinent Historical Findings include:  Past Medical History:  Diagnosis Date  . Anxiety   . Depression   . Frequent headaches   . History of frequent urinary tract infections     No past surgical history on file.  No Known Allergies  Family History  Problem Relation Age of Onset  . Heart disease Maternal Grandfather      Social History   Socioeconomic History  . Marital status: Single    Spouse name: Not on file  . Number of children: Not on file  . Years of education: Not on file  . Highest education level: Not on file  Social Needs  . Financial resource strain: Not on file  . Food insecurity - worry: Not on file  . Food insecurity - inability: Not on file  . Transportation needs - medical: Not on file  . Transportation needs - non-medical: Not on file  Occupational History  . Not on file  Tobacco Use  . Smoking status: Never Smoker  . Smokeless tobacco: Never Used    Substance and Sexual Activity  . Alcohol use: No  . Drug use: No  . Sexual activity: Not on file  Other Topics Concern  . Not on file  Social History Narrative  . Not on file     PHYSICAL EXAM:  VS: BP 122/74 (BP Location: Left Arm, Patient Position: Sitting, Cuff Size: Normal)   Pulse 68   Temp 98.2 F (36.8 C) (Oral)   Ht 5\' 8"  (1.727 m)   Wt 152 lb (68.9 kg)   SpO2 100%   BMI 23.11 kg/m  Physical Exam Gen: NAD, alert, cooperative with exam,  ENT: normal lips, normal nasal mucosa, tympanic membranes clear and intact bilaterally, normal oropharynx, no cervical lymphadenopathy Eye: normal EOM, normal conjunctiva and lids CV:  no edema, +2 pedal pulses, regular rate and rhythm, S1-S2   Resp: no accessory muscle use, non-labored, clear to auscultation bilaterally, no crackles or wheezes Skin: no rashes, no areas of induration  Neuro: normal tone, normal sensation to touch Psych:  normal insight, alert and oriented MSK: Normal gait, normal strength       ASSESSMENT & PLAN:   Upper respiratory tract infection Her symptoms seem viral nature. Nothing on exam demonstrates a bacterial infection just finished doxycycline last Friday. Unclear as to why she has had repeated upper respiratory infections this past month. Testing has been negative with negative rapid strep, Monospot, and flu thus far. Last CBC  did not demonstrate an infection on the white count. - counseled on supportive care  - given indications to follow up

## 2017-07-29 NOTE — Patient Instructions (Signed)
Please try things such as zyrtec-D or allegra-D which is an antihistamine and decongestant.   Please try afrin which will help with nasal congestion but use for only three days.   Please also try using a netti pot on a regular occasion.  Honey can help with a sore throat.   Please follow up with your primary doctor if you don't seem to be getting better by the middle of next week.

## 2017-11-21 ENCOUNTER — Encounter: Payer: No Typology Code available for payment source | Admitting: Family Medicine

## 2017-11-23 ENCOUNTER — Ambulatory Visit (INDEPENDENT_AMBULATORY_CARE_PROVIDER_SITE_OTHER): Payer: No Typology Code available for payment source | Admitting: Family Medicine

## 2017-11-23 ENCOUNTER — Other Ambulatory Visit (HOSPITAL_COMMUNITY)
Admission: RE | Admit: 2017-11-23 | Discharge: 2017-11-23 | Disposition: A | Discharge status: Home / Self Care | Payer: No Typology Code available for payment source | Source: Ambulatory Visit | Attending: Family Medicine | Admitting: Family Medicine

## 2017-11-23 ENCOUNTER — Encounter: Payer: Self-pay | Admitting: Family Medicine

## 2017-11-23 VITALS — BP 104/66 | HR 78 | Temp 98.2°F | Ht 68.0 in | Wt 149.0 lb

## 2017-11-23 DIAGNOSIS — Z0001 Encounter for general adult medical examination with abnormal findings: Secondary | ICD-10-CM

## 2017-11-23 DIAGNOSIS — Z23 Encounter for immunization: Secondary | ICD-10-CM

## 2017-11-23 DIAGNOSIS — N760 Acute vaginitis: Secondary | ICD-10-CM

## 2017-11-23 DIAGNOSIS — Z124 Encounter for screening for malignant neoplasm of cervix: Secondary | ICD-10-CM | POA: Diagnosis not present

## 2017-11-23 DIAGNOSIS — Z0000 Encounter for general adult medical examination without abnormal findings: Secondary | ICD-10-CM

## 2017-11-23 MED ORDER — FLUCONAZOLE 150 MG PO TABS: ORAL_TABLET | ORAL | 0 refills | Status: DC

## 2017-11-23 MED ORDER — VALACYCLOVIR HCL 1 G PO TABS: 1000.0000 mg | ORAL_TABLET | Freq: Two times a day (BID) | ORAL | 0 refills | Status: AC | PRN

## 2017-11-23 NOTE — Progress Notes (Signed)
Subjective:    Takerra Lupinacci is a 27 y.o. female and is here for a comprehensive physical exam.  Pertinent Gynecological History: Patient's last menstrual period was 11/09/2017.  Health Maintenance Due  Topic Date Due  . HIV Screening  08/24/2003  . TETANUS/TDAP  08/24/2007  . PAP SMEAR  09/30/2017   PMHx, SurgHx, SocialHx, Medications, and Allergies were reviewed in the Visit Navigator and updated as appropriate.   Past Medical History:  Diagnosis Date  . Anxiety   . Depression   . Frequent headaches   . History of frequent urinary tract infections    History reviewed. No pertinent surgical history.  Family History  Problem Relation Age of Onset  . Heart disease Maternal Grandfather    Social History   Tobacco Use  . Smoking status: Never Smoker  . Smokeless tobacco: Never Used  Substance Use Topics  . Alcohol use: No  . Drug use: No    Review of Systems:   Pertinent items are noted in the HPI. Otherwise, ROS is negative.  Objective:   BP 104/66   Pulse 78   Temp 98.2 F (36.8 C) (Oral)   Ht  (1.727 m)   Wt 149 lb (67.6 kg)   LMP 11/09/2017   SpO2 98%   BMI 22.66 kg/m    Wt Readings from Last 3 Encounters:  11/23/17 149 lb (67.6 kg)  07/29/17 152 lb (68.9 kg)  07/12/17 152 lb 9.6 oz (69.2 kg)     Ht Readings from Last 3 Encounters:  11/23/17  (1.727 m)  07/29/17  (1.727 m)  06/29/17  (1.727 m)   General appearance: alert, cooperative and appears stated age. Head: normocephalic, without obvious abnormality, atraumatic. Neck: no adenopathy, supple, symmetrical, trachea midline; thyroid not enlarged, symmetric, no tenderness/mass/nodules. Lungs: clear to auscultation bilaterally. Breasts: inspection negative, no nipple retraction or dimpling, no nipple discharge or bleeding, no axillary or supraclavicular adenopathy, normal to palpation without dominant masses. Heart: regular rate and rhythm Abdomen: soft,  non-tender; no masses,  no organomegaly. Extremities: extremities normal, atraumatic, no cyanosis or edema. Skin: skin color, texture, turgor normal, no rashes or lesions. Lymph: cervical, supraclavicular, and axillary nodes normal; no abnormal inguinal nodes palpated. Neurologic: grossly normal.  Pelvic:  External genitalia: no lesions.              Urethra: normal appearing urethra with no masses, tenderness or lesions.              Bartholins and Skenes: normal.               Vagina: normal appearing vagina with normal color, white discharge              Cervix: normal appearance.              Pap and high risk HPV testing done: Yes.          Bimanual Exam:   Uterus: uterus is normal size, shape, consistency and nontender.                                      Adnexa: normal adnexa in size, nontender and no masses.  Assessment/Plan:   [name] was seen today for annual exam.  Diagnoses and all orders for this visit:  Routine physical examination  Pap smear for cervical cancer screening -     Cytology - PAP  Acute vaginitis -     fluconazole (DIFLUCAN) 150 MG tablet; Take one tablet then three days later take another.  Patient Counseling:       Nutrition: Stressed importance of moderation in sodium/caffeine intake, saturated fat and cholesterol, caloric balance, sufficient intake of fresh fruits, vegetables, fiber, calcium, iron, and 1 mg of folate supplement per day (for females capable of pregnancy).        Stressed the importance of regular exercise.        Substance Abuse: Discussed cessation/primary prevention of tobacco, alcohol, or other drug use; driving or other dangerous activities under the influence; availability of treatment for abuse.         Injury prevention: Discussed safety belts, safety helmets, smoke detector, smoking near bedding or upholstery.         Sexuality: Discussed sexually transmitted  diseases, partner selection, use of condoms, avoidance of unintended pregnancy  and contraceptive alternatives.        Dental health: Discussed importance of regular tooth brushing, flossing, and dental visits.        Health maintenance and immunizations reviewed. Please refer to Health maintenance section.   Helane Rima, DO [place] [facility]

## 2017-11-23 NOTE — Addendum Note (Signed)
Addended by: Dorian Pod on: 11/23/2017 10:08 AM   Modules accepted: Orders

## 2017-11-24 LAB — CYTOLOGY - PAP
Bacterial vaginitis: NEGATIVE
Candida vaginitis: NEGATIVE
Chlamydia: NEGATIVE
Diagnosis: NEGATIVE
HPV: NOT DETECTED
Neisseria Gonorrhea: NEGATIVE
Trichomonas: NEGATIVE

## 2017-11-28 LAB — CERVICOVAGINAL ANCILLARY ONLY: Herpes: NEGATIVE

## 2018-01-11 ENCOUNTER — Encounter (HOSPITAL_COMMUNITY): Payer: Self-pay

## 2018-01-11 ENCOUNTER — Other Ambulatory Visit: Payer: Self-pay

## 2018-01-11 ENCOUNTER — Emergency Department (HOSPITAL_COMMUNITY)
Admission: EM | Admit: 2018-01-11 | Discharge: 2018-01-12 | Disposition: A | Discharge status: Home / Self Care | Payer: No Typology Code available for payment source | Attending: Emergency Medicine | Admitting: Emergency Medicine

## 2018-01-11 DIAGNOSIS — K5289 Other specified noninfective gastroenteritis and colitis: Secondary | ICD-10-CM | POA: Insufficient documentation

## 2018-01-11 DIAGNOSIS — Z79899 Other long term (current) drug therapy: Secondary | ICD-10-CM | POA: Diagnosis not present

## 2018-01-11 DIAGNOSIS — R1084 Generalized abdominal pain: Secondary | ICD-10-CM

## 2018-01-11 DIAGNOSIS — N760 Acute vaginitis: Secondary | ICD-10-CM | POA: Insufficient documentation

## 2018-01-11 DIAGNOSIS — R1013 Epigastric pain: Secondary | ICD-10-CM | POA: Diagnosis present

## 2018-01-11 DIAGNOSIS — B9689 Other specified bacterial agents as the cause of diseases classified elsewhere: Secondary | ICD-10-CM

## 2018-01-11 DIAGNOSIS — R1011 Right upper quadrant pain: Secondary | ICD-10-CM

## 2018-01-11 DIAGNOSIS — K529 Noninfective gastroenteritis and colitis, unspecified: Secondary | ICD-10-CM

## 2018-01-11 LAB — COMPREHENSIVE METABOLIC PANEL
ALBUMIN: 4.5 g/dL (ref 3.5–5.0)
ALT: 13 U/L (ref 0–44)
ANION GAP: 11 (ref 5–15)
AST: 15 U/L (ref 15–41)
Alkaline Phosphatase: 59 U/L (ref 38–126)
BUN: 15 mg/dL (ref 6–20)
CHLORIDE: 104 mmol/L (ref 98–111)
CO2: 26 mmol/L (ref 22–32)
Calcium: 9.6 mg/dL (ref 8.9–10.3)
Creatinine, Ser: 0.68 mg/dL (ref 0.44–1.00)
GFR calc Af Amer: >60 mL/min (ref >60)
GFR calc non Af Amer: >60 mL/min (ref >60)
Glucose, Bld: 99 mg/dL (ref 70–99)
POTASSIUM: 4.1 mmol/L (ref 3.5–5.1)
SODIUM: 141 mmol/L (ref 135–145)
Total Bilirubin: 1.6 mg/dL — ABNORMAL HIGH (ref 0.3–1.2)
Total Protein: 7.8 g/dL (ref 6.5–8.1)

## 2018-01-11 LAB — CBC
HEMATOCRIT: 39.2 % (ref 36.0–46.0)
HEMOGLOBIN: 13.3 g/dL (ref 12.0–15.0)
MCH: 30.6 pg (ref 26.0–34.0)
MCHC: 33.9 g/dL (ref 30.0–36.0)
MCV: 90.1 fL (ref 78.0–100.0)
Platelets: 248 10*3/uL (ref 150–400)
RBC: 4.35 MIL/uL (ref 3.87–5.11)
RDW: 14.5 % (ref 11.5–15.5)
WBC: 11.3 10*3/uL — ABNORMAL HIGH (ref 4.0–10.5)

## 2018-01-11 LAB — URINALYSIS, ROUTINE W REFLEX MICROSCOPIC
Bilirubin Urine: NEGATIVE
GLUCOSE, UA: NEGATIVE mg/dL
Hgb urine dipstick: NEGATIVE
Ketones, ur: 80 mg/dL — AB
Leukocytes, UA: NEGATIVE
Nitrite: NEGATIVE
PH: 8 (ref 5.0–8.0)
Protein, ur: NEGATIVE mg/dL
SPECIFIC GRAVITY, URINE: 1.018 (ref 1.005–1.030)

## 2018-01-11 LAB — I-STAT BETA HCG BLOOD, ED (MC, WL, AP ONLY)

## 2018-01-11 LAB — LIPASE, BLOOD: LIPASE: 23 U/L (ref 11–51)

## 2018-01-11 MED ORDER — ONDANSETRON HCL 4 MG/2ML IJ SOLN
4.0000 mg | Freq: Once | INTRAMUSCULAR | Status: AC
  Administered 2018-01-12: 4 mg via INTRAVENOUS
  Filled 2018-01-11: qty 2

## 2018-01-11 MED ORDER — MORPHINE SULFATE (PF) 4 MG/ML IV SOLN
4.0000 mg | Freq: Once | INTRAVENOUS | Status: AC
  Administered 2018-01-12: 4 mg via INTRAVENOUS
  Filled 2018-01-11: qty 1

## 2018-01-11 MED ORDER — ACETAMINOPHEN 325 MG PO TABS
650.0000 mg | ORAL_TABLET | Freq: Once | ORAL | Status: AC
  Administered 2018-01-12: 650 mg via ORAL
  Filled 2018-01-11: qty 2

## 2018-01-11 MED ORDER — SODIUM CHLORIDE 0.9 % IV BOLUS (SEPSIS)
1000.0000 mL | Freq: Once | INTRAVENOUS | Status: AC
  Administered 2018-01-12: 1000 mL via INTRAVENOUS

## 2018-01-11 MED ORDER — IOPAMIDOL (ISOVUE-300) INJECTION 61%
INTRAVENOUS | Status: AC
  Filled 2018-01-11: qty 100

## 2018-01-11 MED ORDER — SODIUM CHLORIDE 0.9 % IV SOLN
1000.0000 mL | INTRAVENOUS | Status: DC
  Administered 2018-01-12: 1000 mL via INTRAVENOUS

## 2018-01-11 NOTE — ED Triage Notes (Signed)
Pt presents to ED from home for ABD pain and fever. Pt reports RUQ pain that started around noon. Pt reports fever of 101 at home. +nausea.

## 2018-01-11 NOTE — ED Provider Notes (Signed)
[facility]-EMERGENCY DEPT Provider Note   CSN: [id number] Arrival date & time: 01/11/18  2110     History   Chief Complaint Chief Complaint  Patient presents with  . Abdominal Pain  . Fever    HPI Caitlyn Munoz is a 27 y.o. female.  Patient presents with fever and abdominal pain.  States she developed epigastric and right upper quadrant abdominal pain at noon today with cramping that comes and goes but is persistent.  She is had nausea but no vomiting.  Has developed fever to 101 at home with anorexia and poor appetite.  No vomiting.  No pain with urination or blood in the urine. Later today, she developed lower abdominal pain.  No vaginal bleeding or vaginal discharge.  Denies possibility of pregnancy.  She is never had this kind of pain before.  Denies any history of ulcers or acid reflux.  No previous abdominal surgeries.  Her pain now seems to be more in her lower stomach than her upper.  The history is provided by the patient.  Abdominal Pain   Associated symptoms include fever and nausea. Pertinent negatives include diarrhea, vomiting, dysuria, hematuria, arthralgias and myalgias.  Fever   Pertinent negatives include no chest pain, no diarrhea, no vomiting, no congestion and no cough.    Past Medical History:  Diagnosis Date  . Anxiety   . Depression   . Frequent headaches   . History of frequent urinary tract infections     Patient Active Problem List   Diagnosis Date Noted  . Headache, tension type, chronic 08/03/2016    History reviewed. No pertinent surgical history.   OB History   None      Home Medications    Prior to Admission medications   Medication Sig Start Date End Date Taking? Authorizing Provider  Probiotic Product (PROBIOTIC PO) Take 2 tablets by mouth daily.   Yes [provider]  fluconazole (DIFLUCAN) 150 MG tablet Take one tablet then three days later take another. Patient not taking: Reported  on 01/11/2018 11/23/17   Helane Rima, DO  valACYclovir (VALTREX) 1000 MG tablet Take 1 tablet (1,000 mg total) by mouth 2 (two) times daily as needed. Patient taking differently: Take 1,000 mg by mouth 2 (two) times daily as needed (outbreaks).  11/23/17   Helane Rima, DO    Family History Family History  Problem Relation Age of Onset  . Heart disease Maternal Grandfather     Social History Social History   Tobacco Use  . Smoking status: Never Smoker  . Smokeless tobacco: Never Used  Substance Use Topics  . Alcohol use: No  . Drug use: No     Allergies   Patient has no known allergies.   Review of Systems Review of Systems  Constitutional: Positive for activity change, appetite change and fever.  HENT: Negative for congestion and rhinorrhea.   Respiratory: Negative for cough, chest tightness and shortness of breath.   Cardiovascular: Negative for chest pain.  Gastrointestinal: Positive for abdominal pain and nausea. Negative for diarrhea and vomiting.  Genitourinary: Positive for flank pain. Negative for dysuria, hematuria, vaginal bleeding and vaginal discharge.  Musculoskeletal: Positive for back pain. Negative for arthralgias and myalgias.  Neurological: Negative for dizziness, weakness and light-headedness.   all other systems are negative except as noted in the HPI and PMH.     Physical Exam Updated Vital Signs BP 104/71 (BP Location: Left Arm)   Pulse 100   Temp (!) 101.6  F (38.7 C) (Oral)   Resp 16   Ht 5\' 8"  (1.727 m)   Wt 65.5 kg (144 lb 4.8 oz)   SpO2 100%   BMI 21.94 kg/m   Physical Exam  Constitutional: She is oriented to person, place, and time. She appears well-developed and well-nourished. No distress.  Ill appearing  HENT:  Head: Normocephalic and atraumatic.  Mouth/Throat: Oropharynx is clear and moist. No oropharyngeal exudate.  Eyes: Pupils are equal, round, and reactive to light. Conjunctivae and EOM are normal.  Neck: Normal range  of motion. Neck supple.  No meningismus.  Cardiovascular: Normal rate, regular rhythm, normal heart sounds and intact distal pulses.  No murmur heard. Pulmonary/Chest: Effort normal and breath sounds normal. No respiratory distress.  Abdominal: Soft. There is tenderness. There is no rebound and no guarding.  Right upper quadrant and lower quadrant tenderness with guarding.  No rebound.  Genitourinary:  Genitourinary Comments: Chaperone present.  Normal external genitalia.  There is no discharge in the vaginal vault.  No CMT.  No lateralizing adnexal tenderness  Musculoskeletal: Normal range of motion. She exhibits no edema or tenderness.  Paraspinal lumbar tenderness, no midline tenderness  Neurological: She is alert and oriented to person, place, and time. No cranial nerve deficit. She exhibits normal muscle tone. Coordination normal.  No ataxia on finger to nose bilaterally. No pronator drift. 5/5 strength throughout. CN 2-12 intact.Equal grip strength. Sensation intact.   Skin: Skin is warm.  Psychiatric: She has a normal mood and affect. Her behavior is normal.  Nursing note and vitals reviewed.    ED Treatments / Results  Labs (all labs ordered are listed, but only abnormal results are displayed) Labs Reviewed  WET PREP, GENITAL - Abnormal; Notable for the following components:      Result Value   Clue Cells Wet Prep HPF POC PRESENT (*)    WBC, Wet Prep HPF POC MANY (*)    All other components within normal limits  COMPREHENSIVE METABOLIC PANEL - Abnormal; Notable for the following components:   Total Bilirubin 1.6 (*)    All other components within normal limits  CBC - Abnormal; Notable for the following components:   WBC 11.3 (*)    All other components within normal limits  URINALYSIS, ROUTINE W REFLEX MICROSCOPIC - Abnormal; Notable for the following components:   Ketones, ur 80 (*)    All other components within normal limits  LIPASE, BLOOD  I-STAT BETA HCG BLOOD, ED  ([facility], [facility], [facility] ONLY)  GC/CHLAMYDIA PROBE AMP ([place]) NOT [facility]    EKG None  Radiology Ct Abdomen Pelvis W Contrast  Result Date: 01/12/2018 CLINICAL DATA:  Abdominal pain and fever. Right upper quadrant pain starting at noon with nausea. EXAM: CT ABDOMEN AND PELVIS WITH CONTRAST TECHNIQUE: Multidetector CT imaging of the abdomen and pelvis was performed using the standard protocol following bolus administration of intravenous contrast. CONTRAST:  100mL ISOVUE-300 IOPAMIDOL (ISOVUE-300) INJECTION 61% COMPARISON:  None. FINDINGS: Lower chest: No acute abnormality. Hepatobiliary: No focal liver abnormality is seen. No gallstones, gallbladder wall thickening, or biliary dilatation. Pancreas: Unremarkable. No pancreatic ductal dilatation or surrounding inflammatory changes. Spleen: Normal in size without focal abnormality. Adrenals/Urinary Tract: Adrenal glands are unremarkable. Kidneys are normal, without renal calculi, focal lesion, or hydronephrosis. Bladder is unremarkable. Stomach/Bowel: Mild fluid-filled distention of small bowel without mechanical bowel obstruction compatible with small bowel enteritis. Moderate stool retention within the colon. The stomach is physiologically distended with fluid. No acute abnormality noted.  Normal caliber appendix. Vascular/Lymphatic: No significant vascular findings are present. No enlarged abdominal or pelvic lymph nodes. Reproductive: Uterus and bilateral adnexa are unremarkable. Other: No free air nor free fluid. Musculoskeletal: No acute or significant osseous findings. IMPRESSION: 1. Mild fluid-filled distention of small bowel without mechanical obstruction suggesting small bowel enteritis. Normal appendix is visualized. 2. No acute solid organ pathology. Electronically Signed   By: Tollie Eth M.D.   On: 01/12/2018 01:41    Procedures Procedures (including critical care time)  Medications Ordered in ED Medications  sodium chloride 0.9 % bolus 1,000  mL (has no administration in time range)    Followed by  0.9 %  sodium chloride infusion (has no administration in time range)  ondansetron (ZOFRAN) injection 4 mg (has no administration in time range)  morphine 4 MG/ML injection 4 mg (has no administration in time range)  acetaminophen (TYLENOL) tablet 650 mg (has no administration in time range)     Initial Impression / Assessment and Plan / ED Course  I have reviewed the triage vital signs and the nursing notes.  Pertinent labs & imaging results that were available during my care of the patient were reviewed by me and considered in my medical decision making (see chart for details).    Patient with abdominal pain and fever.  Her pain seems to have moved to her lower abdomen instead of her upper abdomen.  Urinalysis shows large ketones.  No infection.  hCG negative.  Pelvic exam benign. No adnexal tenderness. Low suspicion for TOA or ovarian torsion.  LFTs and lipase are normal.  CT scan obtained shows normal appendix and multiple fluid-filled loops without bowel obstruction  Patient still with ongoing right-sided abdominal pain.  Gallbladder appears normal on CT scan but will proceed with ultrasound.  We will treat bacterial vaginosis.  Gallbladder is normal and ultrasound.  Patient is tolerating p.o.  Fever has resolved.  Suspect likely viral illness causing enteritis and fever.  Discussed with patient that sometimes appendicitis does not show up on CT scan right away.  She should return to the ED with fever, vomiting, abdominal pain that localizes her right lower quadrant or any other concerns. Patient tolerating p.o. and fever has resolved.  She is comfortable going home.  Her abdomen is soft on recheck.  Return precautions discussed.  Final Clinical Impressions(s) / ED Diagnoses   Final diagnoses:  RUQ pain  Generalized abdominal pain    ED Discharge Orders    None       Roxane Puerto, Jeannett Senior, MD 01/12/18 0800

## 2018-01-12 ENCOUNTER — Emergency Department (HOSPITAL_COMMUNITY): Payer: No Typology Code available for payment source

## 2018-01-12 LAB — GC/CHLAMYDIA PROBE AMP (~~LOC~~) NOT AT ARMC
CHLAMYDIA, DNA PROBE: NEGATIVE
Neisseria Gonorrhea: NEGATIVE

## 2018-01-12 LAB — WET PREP, GENITAL
SPERM: NONE SEEN
Trich, Wet Prep: NONE SEEN
Yeast Wet Prep HPF POC: NONE SEEN

## 2018-01-12 MED ORDER — SODIUM CHLORIDE 0.9 % IV BOLUS
1000.0000 mL | Freq: Once | INTRAVENOUS | Status: AC
  Administered 2018-01-12: 1000 mL via INTRAVENOUS

## 2018-01-12 MED ORDER — SODIUM CHLORIDE 0.9 % IV SOLN: 1000.0000 mL | INTRAVENOUS | Status: DC

## 2018-01-12 MED ORDER — ONDANSETRON 4 MG PO TBDP: 4.0000 mg | ORAL_TABLET | Freq: Three times a day (TID) | ORAL | 0 refills | Status: DC | PRN

## 2018-01-12 MED ORDER — SODIUM CHLORIDE 0.9 % IV BOLUS (SEPSIS)
1000.0000 mL | Freq: Once | INTRAVENOUS | Status: AC
  Administered 2018-01-12: 1000 mL via INTRAVENOUS

## 2018-01-12 MED ORDER — IOPAMIDOL (ISOVUE-300) INJECTION 61%
100.0000 mL | Freq: Once | INTRAVENOUS | Status: AC | PRN
  Administered 2018-01-12: 100 mL via INTRAVENOUS

## 2018-01-12 MED ORDER — METRONIDAZOLE 500 MG PO TABS: 500.0000 mg | ORAL_TABLET | Freq: Two times a day (BID) | ORAL | 0 refills | Status: DC

## 2018-01-12 NOTE — Discharge Instructions (Addendum)
Your testing today is reassuring and shows normal appendix and gallbladder.  Suspect your symptoms are due to viral illness which should improve over the next several days.  As we discussed, sometimes appendicitis does not show up on CT scan right away.  You should follow-up with your doctor.  Return to the ED if you develop worsening symptoms including fever, vomiting, abdominal pain that localizes to the right lower abdomen or any other concerns.

## 2018-06-16 IMAGING — DX DG SHOULDER 2+V*L*
3 series · 3 of 3 positions shown · non-contrast
Comparison: None.

CLINICAL DATA: Limited range of motion and pain common no known
injury, initial encounter

EXAM:
LEFT SHOULDER - 2+ VIEW

[grashey]
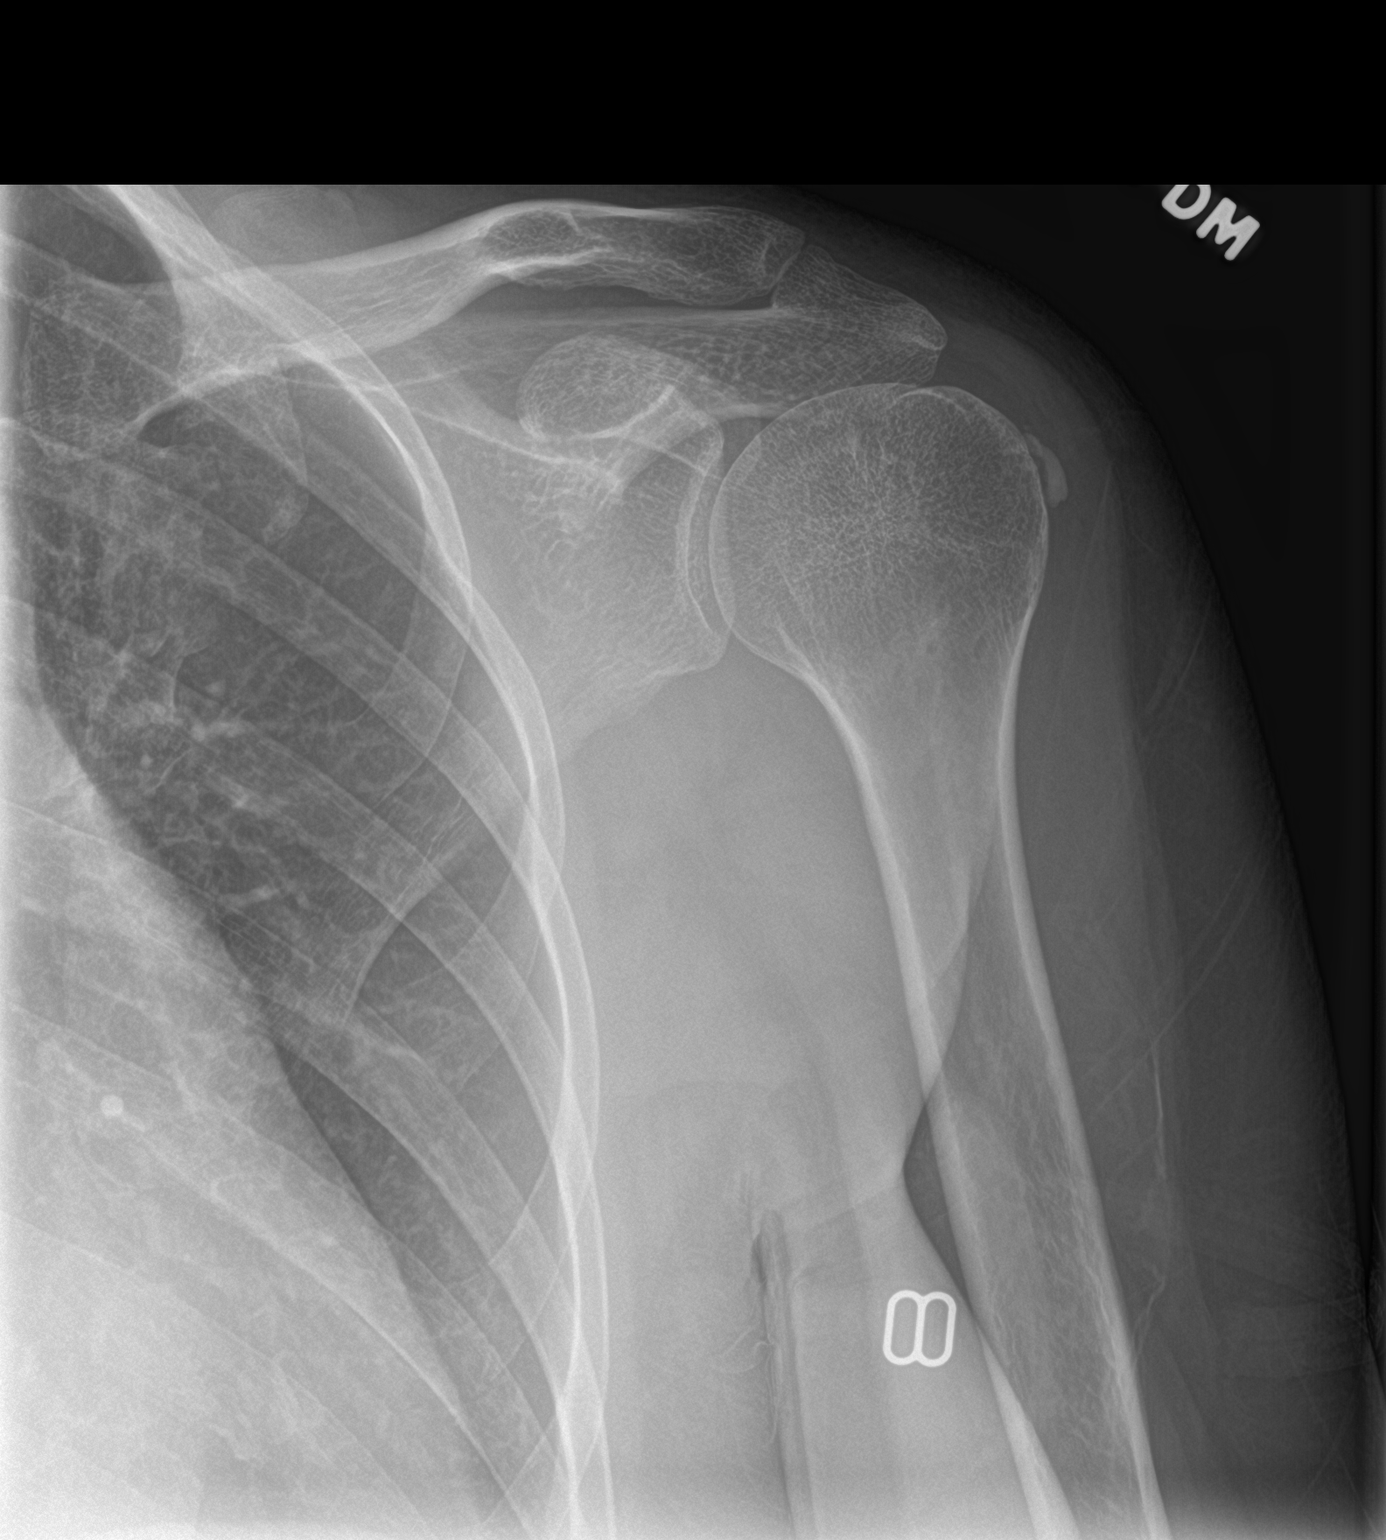

[y view]
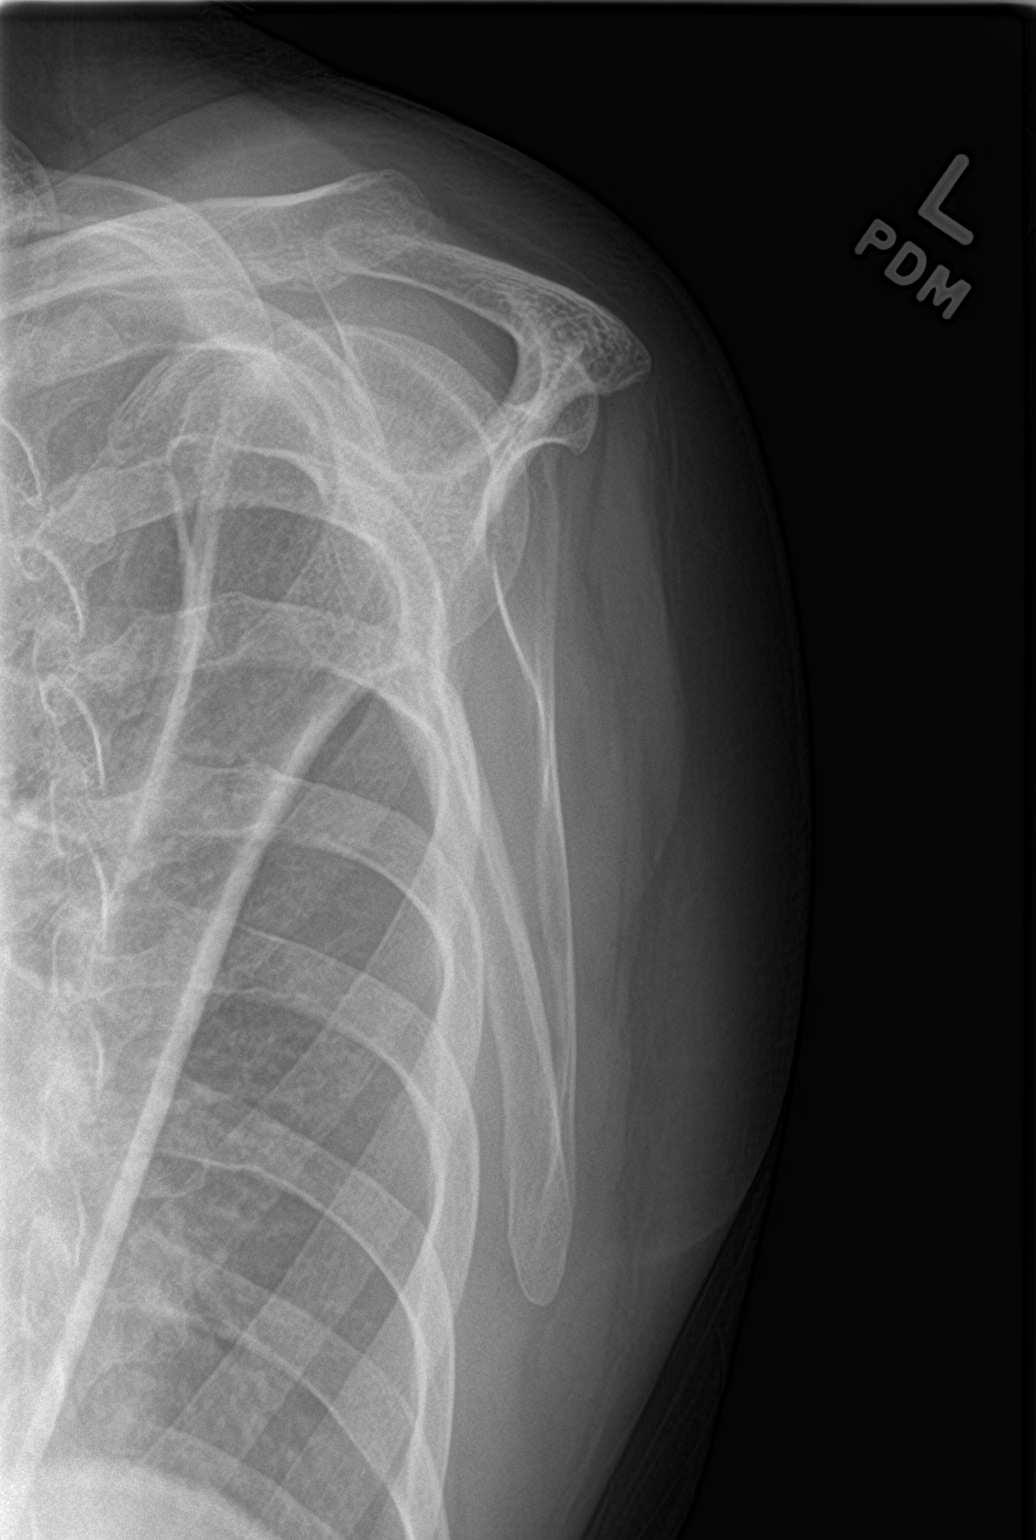

[shoulder axial]
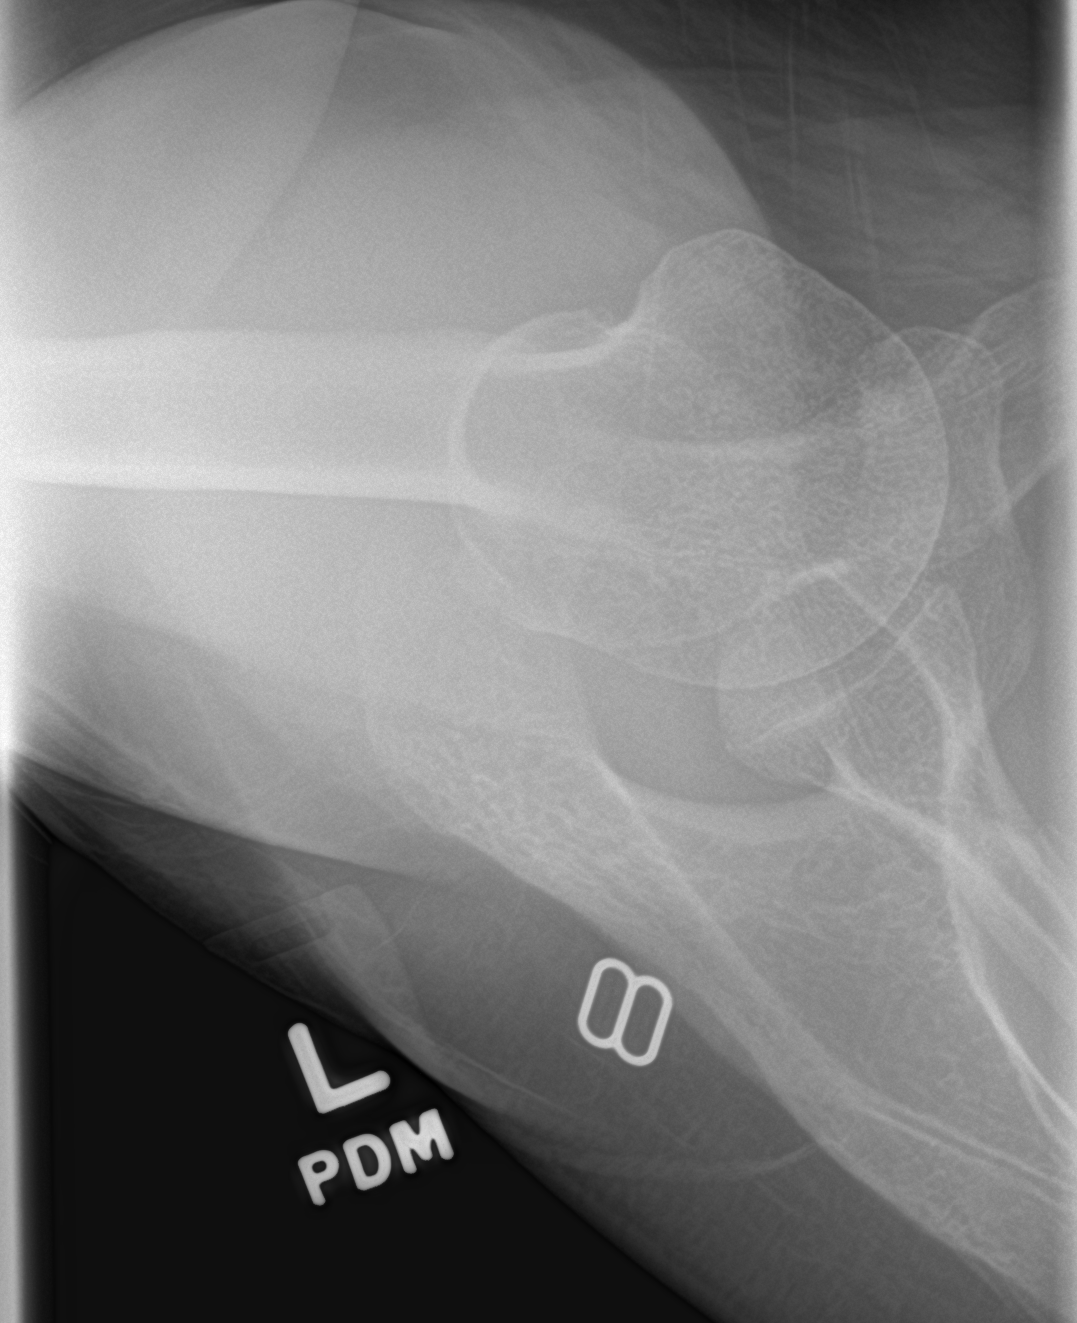

[3 of 3 positions shown; findings below may reference images not displayed]

FINDINGS: There is no evidence of fracture or dislocation. There is no
evidence of arthropathy or other focal bone abnormality. Soft
tissues are unremarkable.
IMPRESSION: No acute abnormality noted.

## 2018-09-01 ENCOUNTER — Ambulatory Visit (INDEPENDENT_AMBULATORY_CARE_PROVIDER_SITE_OTHER): Payer: No Typology Code available for payment source | Admitting: Sports Medicine

## 2018-09-01 ENCOUNTER — Encounter: Payer: Self-pay | Admitting: Sports Medicine

## 2018-09-01 ENCOUNTER — Ambulatory Visit: Payer: Self-pay

## 2018-09-01 ENCOUNTER — Ambulatory Visit (INDEPENDENT_AMBULATORY_CARE_PROVIDER_SITE_OTHER): Payer: No Typology Code available for payment source

## 2018-09-01 VITALS — BP 102/76 | HR 89 | Ht 68.0 in | Wt 148.2 lb

## 2018-09-01 DIAGNOSIS — M25552 Pain in left hip: Secondary | ICD-10-CM | POA: Diagnosis not present

## 2018-09-01 NOTE — Progress Notes (Signed)
Caitlyn Munoz. [facility] [phone]  Caitlyn Munoz - 28 y.o. female MRN [id number]  Date of birth: 31-May-1991  Visit Date: September 01, 2018  PCP: Helane Rima, DO   Referred by: Helane Rima, DO  SUBJECTIVE:  Chief Complaint  Patient presents with  . Initial Assessment    L hip pain    HPI: Patient presents with over 6 months of worsening left hip pain that is located in the groin as well as lateral leg.  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  She reports that going from standing to laying she has a hard time straining her entire leg out.  Cut back on her activities due to this.  Walking and prolonged sitting seem to increase her symptoms.  Sitting crosslegged also worsens her pain.  Ice does seem to help this.  REVIEW OF SYSTEMS: No significant nighttime awakenings due to this issue. Denies fevers, chills, recent weight gain or weight loss.  No night sweats.  Pt denies any change in bowel or bladder habits, muscle weakness, numbness or falls associated with this pain.  HISTORY:  Prior history reviewed and updated per electronic medical record.  Patient Active Problem List   Diagnosis Date Noted  . Headache, tension type, chronic 08/03/2016   Social History   Occupational History  . Not on file  Tobacco Use  . Smoking status: Never Smoker  . Smokeless tobacco: Never Used  Substance and Sexual Activity  . Alcohol use: No  . Drug use: No  . Sexual activity: Not on file   Social History   Social History Narrative  . Not on file     OBJECTIVE:  VS:  HT:5\' 8"  (172.7 cm)   WT:148 lb 3.2 oz (67.2 kg)  BMI:22.54    BP:102/76  HR:89bpm  TEMP: ( )  RESP:99 %   PHYSICAL EXAM: Adult female. No acute distress.  Alert and appropriate. Patient's left hip is held in slight flexion.  She has pain over the greater trochanter  that is mild.  Pain is worse with axial load and circumduction of the hip.  She has pain with Darden Palmer testing as well as weakness.  Some of this pain does localize into the groin as well to the back.  Pain with logroll.  Positive Faber testing and FADIR testing.   ASSESSMENT:  No diagnosis found.  PROCEDURES:  US Guided Injection per procedure note       PLAN:  Pertinent additional documentation may be included in corresponding procedure notes, imaging studies, problem based documentation and patient instructions.  No problem-specific Assessment & Plan notes found for this encounter.  I am concerned for potential intra-articular labral tear versus hip synovitis.  Diagnostic and therapeutic intra-articular injection performed today.  If any lack of improvement over the next 2 weeks she will call us to schedule an MR arthrogram of her left hip.  Links to [employer] provided today per Patient Instructions.  These exercises were developed by Myles Lipps, DC with a strong emphasis on core neuromuscular reducation and postural realignment through body-weight exercises.    Discussed the underlying features of tight hip flexors leading to crouched, fetal like position that results in spinal column compression.  Including lumbar hyperflexion with hypermobility, thoracic flexion with restrictive rotation and cervical lordosis reversal.     Activity modifications and the importance of avoiding exacerbating  activities (limiting pain to no more than a 4 / 10 during or following activity) recommended and discussed.   Discussed red flag symptoms that warrant earlier emergent evaluation and patient voices understanding.    No orders of the defined types were placed in this encounter. Lab Orders  No laboratory test(s) ordered today   Imaging Orders  No imaging studies ordered today   Referral Orders  No referral(s) requested today    At follow up will plan to consider : Can  consider further advanced diagnostic imaging  No follow-ups on file.          Andrena Mews, DO    [facility]

## 2018-09-01 NOTE — Patient Instructions (Addendum)
You had an injection today.  Things to be aware of after injection are listed below: . You may experience no significant improvement or even a slight worsening in your symptoms during the first 24 to 48 hours.  After that we expect your symptoms to improve gradually over the next 2 weeks for the medicine to have its maximal effect.  You should continue to have improvement out to 6 weeks after your injection. . Dr. Berline Chough recommends icing the site of the injection for 20 minutes  1-2 times the day of your injection . You may shower but no swimming, tub bath or Jacuzzi for 24 hours. . If your bandage falls off this does not need to be replaced.  It is appropriate to remove the bandage after 4 hours. . You may resume light activities as tolerated unless otherwise directed per Dr. Berline Chough during your visit  POSSIBLE STEROID SIDE EFFECTS:  Side effects from injectable steroids tend to be less than when taken orally however you may experience some of the symptoms listed below.  If experienced these should only last for a short period of time. Change in menstrual flow  Edema (swelling)  Increased appetite Skin flushing (redness)  Skin rash/acne  Thrush (oral) Yeast vaginitis    Increased sweating  Depression Increased blood glucose levels Cramping and leg/calf  Euphoria (feeling happy)  POSSIBLE PROCEDURE SIDE EFFECTS: The side effects of the injection are usually fairly minimal however if you may experience some of the following side effects that are usually self-limited and will is off on their own.  If you are concerned please feel free to call the office with questions:  Increased numbness or tingling  Nausea or vomiting  Swelling or bruising at the injection site   Please call our office if if you experience any of the following symptoms over the next week as these can be signs of infection:   Fever greater than 100.74F  Significant swelling at the injection site  Significant redness or drainage  from the injection site  If after 2 weeks you are continuing to have worsening symptoms please call our office to discuss what the next appropriate actions should be including the potential for a return office visit or other diagnostic testing.   Also check out [employer]" which is a program developed by Dr. Myles Lipps.   There are links to a couple of his YouTube Videos below and I would like to see you performing one of his videos 5-6 days per week.  It is best to do these exercises first thing in the morning.  They will give you a good jumpstart here today and start normalizing the way you move.  A good intro video is: "Independence from Pain 7-minute Video" - [email or web address]   A more advanced video is: Scientist, research (medical) original 12 minutes" - [email or web address]  Exercises that focus more on the neck are as below: Dr. Derrill Kay with [employer] teaching neck and shoulder details Part 1 - https://youtu.be/[email or web address] Part 2 Dr. Derrill Kay with [facility] quick routine to practice daily - https://youtu.be/[email or web address]  Do not try to attempt the entire video when first beginning.  Try breaking of each exercise that he goes into shorter segments.  In other words, if they perform an exercise for 45 seconds, start with 15 seconds and rest and then resume when they begin the new activity.  If you work your way up to being able to do these videos without having to stop, I expect  you will see significant improvements in your pain.  If you enjoy his videos and would like to find out more you can look on his website: [email or web address].  He has a workout streaming option as well as a DVD set available for purchase.  Amazon has the best price for his DVDs.

## 2018-09-01 NOTE — Progress Notes (Signed)
PROCEDURE NOTE:  Ultrasound Guided: Injection: Left hip Images were obtained and interpreted by myself, Gaspar Bidding, DO  Images have been saved and stored to PACS system. Images obtained on: GE S7 Ultrasound machine    ULTRASOUND FINDINGS:  She has a moderate effusion.  No significant degenerative spurring.  DESCRIPTION OF PROCEDURE:  The patient's clinical condition is marked by substantial pain and/or significant functional disability. Other conservative therapy has not provided relief, is contraindicated, or not appropriate. There is a reasonable likelihood that injection will significantly improve the patient's pain and/or functional impairment.   After discussing the risks, benefits and expected outcomes of the injection and all questions were reviewed and answered, the patient wished to undergo the above named procedure.  Verbal consent was obtained.  The ultrasound was used to identify the target structure and adjacent neurovascular structures. The skin was then prepped in sterile fashion and the target structure was injected under direct visualization using sterile technique as below:  Single injection performed as below: PREP: Alcohol and Ethel Chloride APPROACH:direct, stopcock technique, 22g 3.5 in. INJECTATE: 5 cc 1% lidocaine, 2 cc 0.5% Marcaine and 2 cc 40mg /mL DepoMedrol ASPIRATE: None DRESSING: Band-Aid  Post procedural instructions including recommending icing and warning signs for infection were reviewed.    This procedure was well tolerated and there were no complications.   IMPRESSION: Succesful Ultrasound Guided: Injection

## 2018-09-18 ENCOUNTER — Ambulatory Visit: Payer: No Typology Code available for payment source | Admitting: Sports Medicine

## 2018-11-27 ENCOUNTER — Encounter: Payer: No Typology Code available for payment source | Admitting: Family Medicine
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
## 2019-01-16 ENCOUNTER — Other Ambulatory Visit: Payer: Self-pay

## 2019-01-16 ENCOUNTER — Ambulatory Visit (INDEPENDENT_AMBULATORY_CARE_PROVIDER_SITE_OTHER): Payer: Commercial Managed Care - PPO | Admitting: Family Medicine

## 2019-01-16 ENCOUNTER — Encounter: Payer: Self-pay | Admitting: Family Medicine

## 2019-01-16 VITALS — BP 100/80 | HR 80 | Temp 98.5°F | Ht 68.0 in | Wt 151.2 lb

## 2019-01-16 DIAGNOSIS — F418 Other specified anxiety disorders: Secondary | ICD-10-CM

## 2019-01-16 DIAGNOSIS — R5383 Other fatigue: Secondary | ICD-10-CM

## 2019-01-16 DIAGNOSIS — Z114 Encounter for screening for human immunodeficiency virus [HIV]: Secondary | ICD-10-CM

## 2019-01-16 DIAGNOSIS — Z0000 Encounter for general adult medical examination without abnormal findings: Secondary | ICD-10-CM

## 2019-01-16 DIAGNOSIS — Z13220 Encounter for screening for lipoid disorders: Secondary | ICD-10-CM | POA: Diagnosis not present

## 2019-01-16 LAB — CBC WITH DIFFERENTIAL/PLATELET
Basophils Absolute: 0 10*3/uL (ref 0.0–0.1)
Basophils Relative: 0.6 % (ref 0.0–3.0)
Eosinophils Absolute: 0 10*3/uL (ref 0.0–0.7)
Eosinophils Relative: 0.8 % (ref 0.0–5.0)
HCT: 40 % (ref 36.0–46.0)
Hemoglobin: 13.4 g/dL (ref 12.0–15.0)
Lymphocytes Relative: 28.7 % (ref 12.0–46.0)
Lymphs Abs: 1.4 10*3/uL (ref 0.7–4.0)
MCHC: 33.4 g/dL (ref 30.0–36.0)
MCV: 92.2 fl (ref 78.0–100.0)
Monocytes Absolute: 0.3 10*3/uL (ref 0.1–1.0)
Monocytes Relative: 5.3 % (ref 3.0–12.0)
Neutro Abs: 3.1 10*3/uL (ref 1.4–7.7)
Neutrophils Relative %: 64.6 % (ref 43.0–77.0)
Platelets: 254 10*3/uL (ref 150.0–400.0)
RBC: 4.34 Mil/uL (ref 3.87–5.11)
RDW: 13.8 % (ref 11.5–15.5)
WBC: 4.9 10*3/uL (ref 4.0–10.5)

## 2019-01-16 LAB — COMPREHENSIVE METABOLIC PANEL
ALT: 9 U/L (ref 0–35)
AST: 12 U/L (ref 0–37)
Albumin: 4.6 g/dL (ref 3.5–5.2)
Alkaline Phosphatase: 57 U/L (ref 39–117)
BUN: 8 mg/dL (ref 6–23)
CO2: 28 mEq/L (ref 19–32)
Calcium: 9.6 mg/dL (ref 8.4–10.5)
Chloride: 104 mEq/L (ref 96–112)
Creatinine, Ser: 0.73 mg/dL (ref 0.40–1.20)
GFR: 93.36 mL/min (ref >60.00)
Glucose, Bld: 92 mg/dL (ref 70–99)
Potassium: 4.6 mEq/L (ref 3.5–5.1)
Sodium: 140 mEq/L (ref 135–145)
Total Bilirubin: 0.5 mg/dL (ref 0.2–1.2)
Total Protein: 6.9 g/dL (ref 6.0–8.3)

## 2019-01-16 LAB — LIPID PANEL
Cholesterol: 192 mg/dL (ref 0–200)
HDL: 70.1 mg/dL (ref >39.00)
LDL Cholesterol: 104 mg/dL — ABNORMAL HIGH (ref 0–99)
NonHDL: 122.34
Total CHOL/HDL Ratio: 3
Triglycerides: 91 mg/dL (ref 0.0–149.0)
VLDL: 18.2 mg/dL (ref 0.0–40.0)

## 2019-01-16 MED ORDER — PROPRANOLOL HCL 20 MG PO TABS: 20.0000 mg | ORAL_TABLET | Freq: Three times a day (TID) | ORAL | 1 refills | Status: AC | PRN

## 2019-01-16 MED ORDER — ALPRAZOLAM 0.5 MG PO TABS: 0.5000 mg | ORAL_TABLET | Freq: Two times a day (BID) | ORAL | 0 refills | Status: AC | PRN

## 2019-01-17 LAB — HIV ANTIBODY (ROUTINE TESTING W REFLEX): HIV 1&2 Ab, 4th Generation: NONREACTIVE

## 2019-01-21 ENCOUNTER — Encounter: Payer: Self-pay | Admitting: Family Medicine

## 2019-02-28 ENCOUNTER — Encounter: Payer: Self-pay | Admitting: Family Medicine

## 2019-03-02 MED ORDER — SCOPOLAMINE 1 MG/3DAYS TD PT72: 1.0000 | MEDICATED_PATCH | TRANSDERMAL | 0 refills | Status: AC

## 2020-04-21 IMAGING — DX DG HIP (WITH OR WITHOUT PELVIS) 2-3V*L*
2 series · 2 of 2 positions shown · non-contrast
Comparison: None.

CLINICAL DATA: Left hip pain.  No injury.

EXAM:
DG HIP (WITH OR WITHOUT PELVIS) 2-3V LEFT

[pelvis ap]
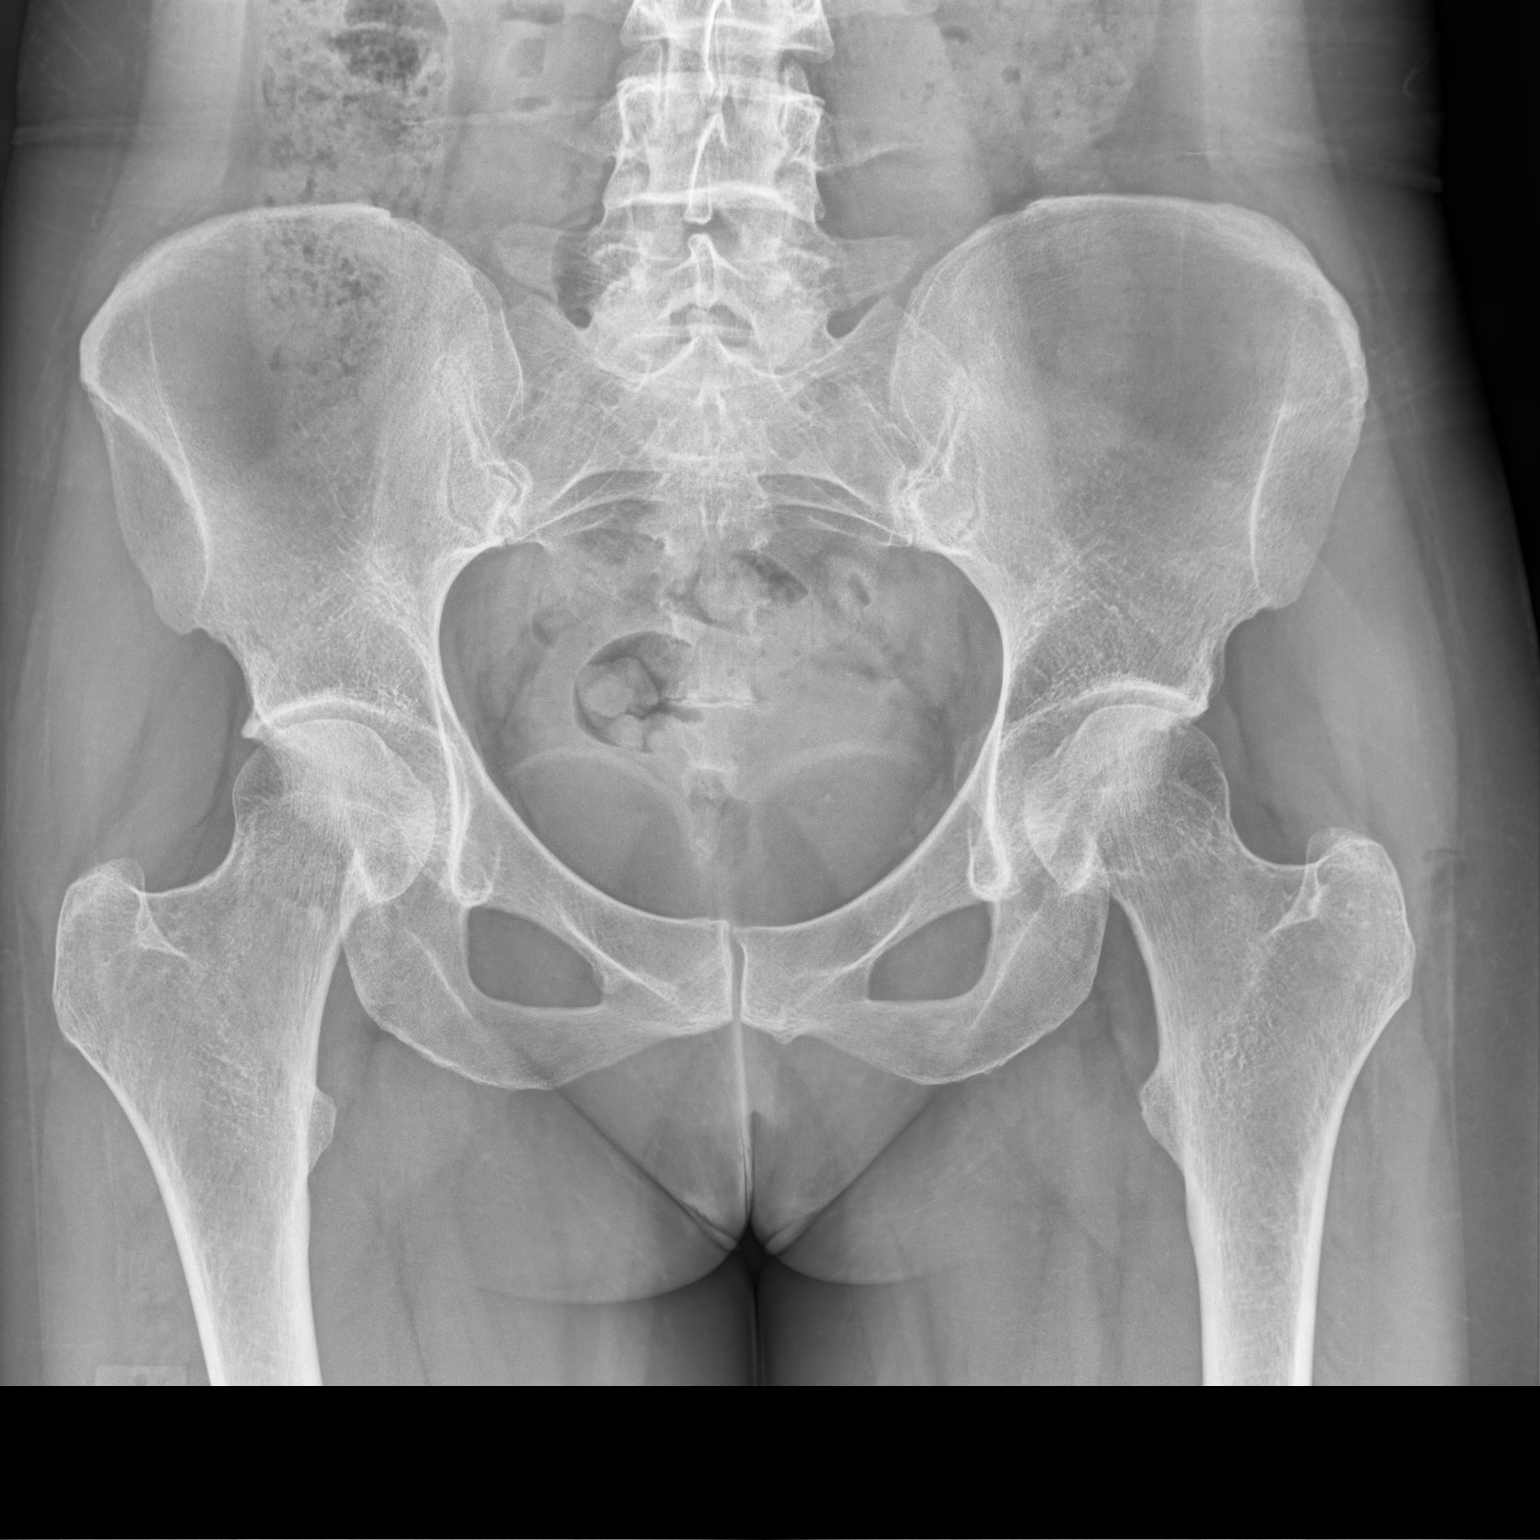

[hip joint ap]
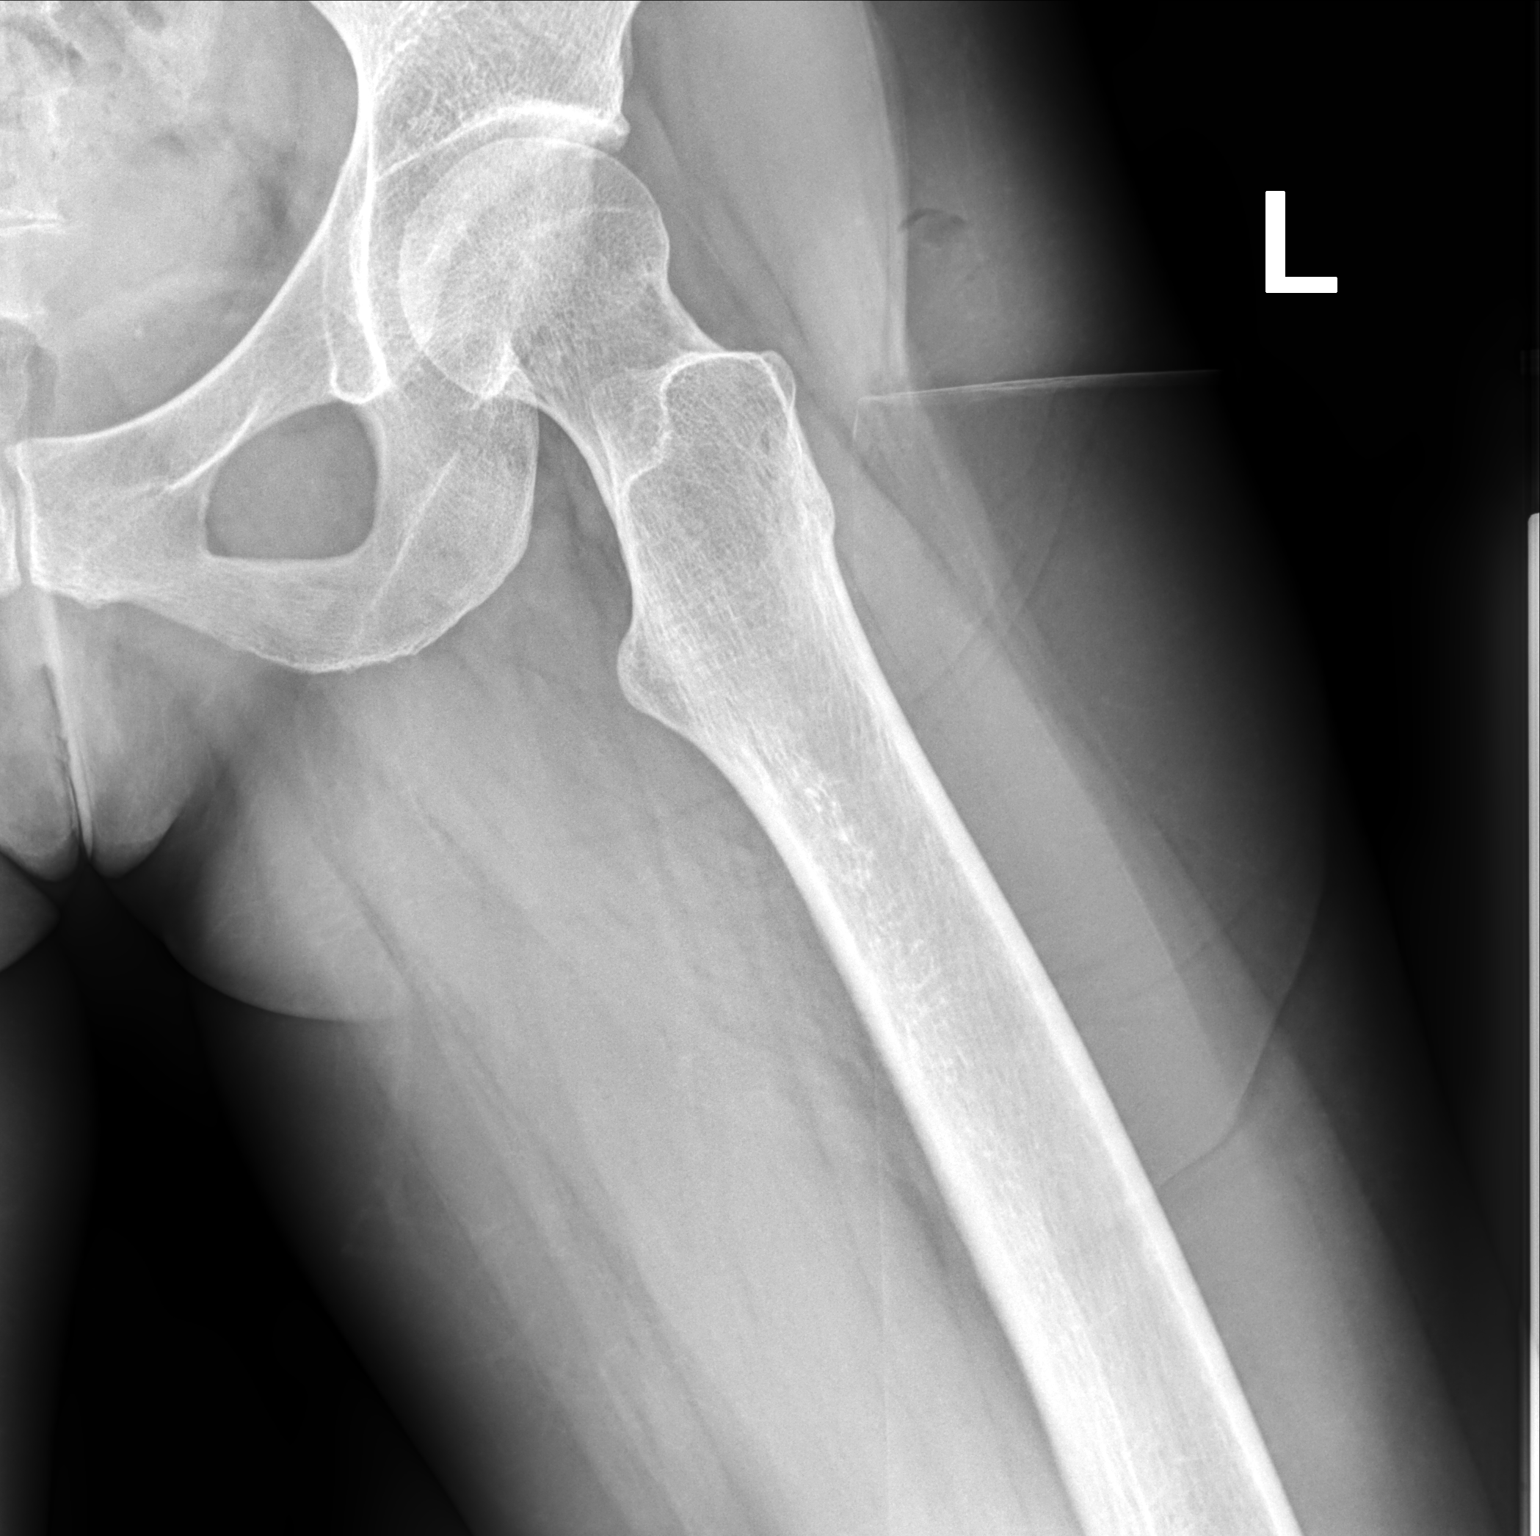

[2 of 2 positions shown; findings below may reference images not displayed]

FINDINGS: There is no evidence of hip fracture or dislocation. There is no
evidence of arthropathy or other focal bone abnormality.
IMPRESSION: Negative.
